# Patient Record
Sex: Female | Born: 1995 | Race: Black or African American | Hispanic: No | Marital: Single | Smoking: Current every day smoker
Health system: Southern US, Community
[De-identification: ages and names within clinical notes are randomized; demographics above are authoritative.]

## PROBLEM LIST (undated history)

## (undated) DIAGNOSIS — B977 Papillomavirus as the cause of diseases classified elsewhere: Secondary | ICD-10-CM

## (undated) DIAGNOSIS — B86 Scabies: Secondary | ICD-10-CM

## (undated) HISTORY — PX: NO PAST SURGERIES: SHX2092

---

## 2014-11-06 ENCOUNTER — Emergency Department (HOSPITAL_COMMUNITY)
Admission: EM | Admit: 2014-11-06 | Discharge: 2014-11-06 | Disposition: A | Discharge status: Home / Self Care | Payer: Medicaid Other | Attending: Emergency Medicine | Admitting: Emergency Medicine

## 2014-11-06 ENCOUNTER — Emergency Department (HOSPITAL_COMMUNITY): Payer: Medicaid Other

## 2014-11-06 ENCOUNTER — Encounter (HOSPITAL_COMMUNITY): Payer: Self-pay | Admitting: *Deleted

## 2014-11-06 DIAGNOSIS — R000 Tachycardia, unspecified: Secondary | ICD-10-CM | POA: Insufficient documentation

## 2014-11-06 DIAGNOSIS — Z3490 Encounter for supervision of normal pregnancy, unspecified, unspecified trimester: Secondary | ICD-10-CM

## 2014-11-06 DIAGNOSIS — B349 Viral infection, unspecified: Secondary | ICD-10-CM

## 2014-11-06 DIAGNOSIS — O98811 Other maternal infectious and parasitic diseases complicating pregnancy, first trimester: Secondary | ICD-10-CM | POA: Insufficient documentation

## 2014-11-06 DIAGNOSIS — Z3A01 Less than 8 weeks gestation of pregnancy: Secondary | ICD-10-CM | POA: Diagnosis not present

## 2014-11-06 DIAGNOSIS — O9989 Other specified diseases and conditions complicating pregnancy, childbirth and the puerperium: Secondary | ICD-10-CM | POA: Insufficient documentation

## 2014-11-06 DIAGNOSIS — F17210 Nicotine dependence, cigarettes, uncomplicated: Secondary | ICD-10-CM | POA: Insufficient documentation

## 2014-11-06 DIAGNOSIS — O99331 Smoking (tobacco) complicating pregnancy, first trimester: Secondary | ICD-10-CM | POA: Insufficient documentation

## 2014-11-06 LAB — BASIC METABOLIC PANEL
Anion gap: 10 (ref 5–15)
BUN: 6 mg/dL (ref 6–20)
CO2: 21 mmol/L — AB (ref 22–32)
CREATININE: 1.03 mg/dL — AB (ref 0.44–1.00)
Calcium: 9.3 mg/dL (ref 8.9–10.3)
Chloride: 103 mmol/L (ref 101–111)
GFR calc Af Amer: >60 mL/min (ref >60)
GLUCOSE: 86 mg/dL (ref 65–99)
POTASSIUM: 4.2 mmol/L (ref 3.5–5.1)
Sodium: 134 mmol/L — ABNORMAL LOW (ref 135–145)

## 2014-11-06 LAB — CBC
HCT: 46.1 % — ABNORMAL HIGH (ref 36.0–46.0)
Hemoglobin: 15.3 g/dL — ABNORMAL HIGH (ref 12.0–15.0)
MCH: 27 pg (ref 26.0–34.0)
MCHC: 33.2 g/dL (ref 30.0–36.0)
MCV: 81.3 fL (ref 78.0–100.0)
Platelets: 266 10*3/uL (ref 150–400)
RBC: 5.67 MIL/uL — AB (ref 3.87–5.11)
RDW: 13.9 % (ref 11.5–15.5)
WBC: 6.9 10*3/uL (ref 4.0–10.5)

## 2014-11-06 LAB — POC URINE PREG, ED: PREG TEST UR: POSITIVE — AB

## 2014-11-06 MED ORDER — ALBUTEROL SULFATE HFA 108 (90 BASE) MCG/ACT IN AERS
2.0000 | INHALATION_SPRAY | Freq: Once | RESPIRATORY_TRACT | Status: AC
  Administered 2014-11-06: 2 via RESPIRATORY_TRACT
  Filled 2014-11-06: qty 6.7

## 2014-11-06 MED ORDER — PERMETHRIN 5 % EX CREA: TOPICAL_CREAM | CUTANEOUS | Status: DC

## 2014-11-06 MED ORDER — PRENATAL COMPLETE 14-0.4 MG PO TABS: 1.0000 | ORAL_TABLET | Freq: Every day | ORAL | Status: DC

## 2014-11-06 NOTE — ED Provider Notes (Signed)
CSN: [id number]     Arrival date & time 11/06/14  1207 History   First MD Initiated Contact with Patient 11/06/14 1243     Chief Complaint  Patient presents with  . Shortness of Breath     (Consider location/radiation/quality/duration/timing/severity/associated sxs/prior Treatment) HPI  Madeline [name] is a 19 y.o. female presenting with 2 days of cough productive of thick green mucus as well as rhinorrhea, sore throat and shortness of breath. She denies chest pain. Shortness of breath worse with cough. Patient denies fevers but endorses chills. Patient is not taken anything for her symptoms. She denies alleviating or aggravating factors. Patient smokes half pack of cigarettes daily. No history of asthma. Patient also reports generalized weakness and pains. Pt denies history of DVT, PE, recent surgery or trauma, malignancy, hemoptysis, exogenous estrogen use, unilateral leg swelling or tenderness, immobilization.   Patient also complaining of 2 weeks of diffuse pruritus worse on legs and hands. Boyfriend recently diagnosed with scabies. Denies groin involvement.   History reviewed. No pertinent past medical history. History reviewed. No pertinent past surgical history. No family history on file. History  Substance Use Topics  . Smoking status: Current Every Day Smoker  . Smokeless tobacco: Not on file  . Alcohol Use: No   OB History    No data available     Review of Systems 10 Systems reviewed and are negative for acute change except as noted in the HPI.    Allergies  Review of patient's allergies indicates no known allergies.  Home Medications   Prior to Admission medications   Medication Sig Start Date End Date Taking? Authorizing Provider  permethrin (ELIMITE) 5 % cream Apply to affected area once and then again one week later 11/06/14   Oswaldo ConroyVictoria Shondell Fabel, PA-C  Prenatal Vit-Fe Fumarate-FA (PRENATAL COMPLETE) 14-0.4 MG TABS Take 1 tablet by mouth daily. 11/06/14   Oswaldo ConroyVictoria  Nandita Mathenia, PA-C   BP 124/84 mmHg  Pulse 96  Temp(Src) 98 F (36.7 C) (Oral)  Resp 16  Ht 5\' 7"  (1.702 m)  Wt 164 lb (74.39 kg)  BMI 25.68 kg/m2  SpO2 100%  LMP 10/07/2014 Physical Exam  Constitutional: She appears well-developed and well-nourished. No distress.  HENT:  Head: Normocephalic and atraumatic.  Mouth/Throat: Posterior oropharyngeal edema and posterior oropharyngeal erythema present. No oropharyngeal exudate.  Eyes: Conjunctivae and EOM are normal. Right eye exhibits no discharge. Left eye exhibits no discharge.  Cardiovascular: Regular rhythm.   tachycardia  Pulmonary/Chest: Effort normal and breath sounds normal. No respiratory distress. She has no wheezes.  Abdominal: Soft. Bowel sounds are normal. She exhibits no distension. There is no tenderness.  Lymphadenopathy:    She has cervical adenopathy.  Neurological: She is alert. She exhibits normal muscle tone. Coordination normal.  Skin: Skin is warm and dry. She is not diaphoretic.  No clear tracks to lower lip spacing. Patient with excoriation to diffuse lower extremity. No rash. No evidence of secondary infection.  Nursing note and vitals reviewed.   ED Course  Procedures (including critical care time) Labs Review Labs Reviewed  CBC - Abnormal; Notable for the following:    RBC 5.67 (*)    Hemoglobin 15.3 (*)    HCT 46.1 (*)    All other components within normal limits  BASIC METABOLIC PANEL - Abnormal; Notable for the following:    Sodium 134 (*)    CO2 21 (*)    Creatinine, Ser 1.03 (*)    All other components within normal limits  POC URINE PREG, ED - Abnormal; Notable for the following:    Preg Test, Ur POSITIVE (*)    All other components within normal limits    Imaging Review No results found.   EKG Interpretation   Date/Time:  Sunday Nov 06 2014 12:59:22 EDT Ventricular Rate:  94 PR Interval:  144 QRS Duration: 75 QT Interval:  331 QTC Calculation: 414 R Axis:   42 Text Interpretation:   Sinus rhythm Confirmed by Fayrene Fearing  MD, MARK ([id number]) on  11/06/2014 1:22:38 PM      Meds given in ED:  Medications  albuterol (PROVENTIL HFA;VENTOLIN HFA) 108 (90 BASE) MCG/ACT inhaler 2 puff (2 puffs Inhalation Given 11/06/14 1408)    Discharge Medication List as of 11/06/2014  3:21 PM    START taking these medications   Details  Prenatal Vit-Fe Fumarate-FA (PRENATAL COMPLETE) 14-0.4 MG TABS Take 1 tablet by mouth daily., Starting 11/06/2014, Until Discontinued, Print           MDM   Final diagnoses:  Viral syndrome  Pregnancy   Patient presenting with shortness of breath, cough, rhinorrhea. No chest pain. Lungs clear to auscultation bilaterally, cervical lymphadenopathy. Pt reports improvement of symptoms with albuterol. Patient with initial tachycardia and is low risk factors for PE. No hypoxia. No respiratory distress. I doubt PE in the setting of viral syndrome. Pt also pregnant which is likely contributing to higher HR. Pt refusing CXR due to pregnancy. I doubt PNA without fever or leukocytosis. Pt with viral syndrome.  Discussed return precautions. Pt also presenting with pruritis and close contact diagnosed with scabies. Pt without rash. Permethrin provided. Pt to take either claritin or benadryl for pruritis. Pt given prenatal vitamins and [facility]  Discussed return precautions with patient. Discussed all results and patient verbalizes understanding and agrees with plan.  Case has been discussed with Dr. Fayrene Fearing who agrees with the above plan and to discharge.      Oswaldo Conroy, PA-C 11/07/14 1021  Rolland Porter, MD 11/16/14 [phone]

## 2014-11-06 NOTE — ED Notes (Signed)
Pt thinks she has a cold and reports symptoms of cough, stuffy nose, sore throat, and feels like she is short of breath.  No asthma.  Pt feels weak

## 2014-11-06 NOTE — ED Notes (Signed)
Pt sts that she does not want x-ray

## 2014-11-06 NOTE — Discharge Instructions (Signed)
Return to the emergency room with worsening of symptoms, new symptoms or with symptoms that are concerning, especially fevers, chest pain, shortness of breath, difficulty breathing, coughing up blood. Start taking prenatal vitamins. Drink plenty of fluids with electrolytes. Follow-up with [facility] for prenatal care. Albuterol inhaler for shortness of breath. Claritin or Zyrtec without decongestant for allergies. Read below information and follow recommendations. First Trimester of Pregnancy The first trimester of pregnancy is from week 1 until the end of week 12 (months 1 through 3). A week after a sperm fertilizes an egg, the egg will implant on the wall of the uterus. This embryo will begin to develop into a baby. Genes from you and your partner are forming the baby. The female genes determine whether the baby is a boy or a girl. At 6-8 weeks, the eyes and face are formed, and the heartbeat can be seen on ultrasound. At the end of 12 weeks, all the baby's organs are formed.  Now that you are pregnant, you will want to do everything you can to have a healthy baby. Two of the most important things are to get good prenatal care and to follow your health care provider's instructions. Prenatal care is all the medical care you receive before the baby's birth. This care will help prevent, find, and treat any problems during the pregnancy and childbirth. BODY CHANGES Your body goes through many changes during pregnancy. The changes vary from woman to woman.   You may gain or lose a couple of pounds at first.  You may feel sick to your stomach (nauseous) and throw up (vomit). If the vomiting is uncontrollable, call your health care provider.  You may tire easily.  You may develop headaches that can be relieved by medicines approved by your health care provider.  You may urinate more often. Painful urination may mean you have a bladder infection.  You may develop heartburn as a result of your  pregnancy.  You may develop constipation because certain hormones are causing the muscles that push waste through your intestines to slow down.  You may develop hemorrhoids or swollen, bulging veins (varicose veins).  Your breasts may begin to grow larger and become tender. Your nipples may stick out more, and the tissue that surrounds them (areola) may become darker.  Your gums may bleed and may be sensitive to brushing and flossing.  Dark spots or blotches (chloasma, mask of pregnancy) may develop on your face. This will likely fade after the baby is born.  Your menstrual periods will stop.  You may have a loss of appetite.  You may develop cravings for certain kinds of food.  You may have changes in your emotions from day to day, such as being excited to be pregnant or being concerned that something may go wrong with the pregnancy and baby.  You may have more vivid and strange dreams.  You may have changes in your hair. These can include thickening of your hair, rapid growth, and changes in texture. Some women also have hair loss during or after pregnancy, or hair that feels dry or thin. Your hair will most likely return to normal after your baby is born. WHAT TO EXPECT AT YOUR PRENATAL VISITS During a routine prenatal visit:  You will be weighed to make sure you and the baby are growing normally.  Your blood pressure will be taken.  Your abdomen will be measured to track your baby's growth.  The fetal heartbeat will be listened to  starting around week 10 or 12 of your pregnancy.  Test results from any previous visits will be discussed. Your health care provider may ask you:  How you are feeling.  If you are feeling the baby move.  If you have had any abnormal symptoms, such as leaking fluid, bleeding, severe headaches, or abdominal cramping.  If you have any questions. Other tests that may be performed during your first trimester include:  Blood tests to find your  blood type and to check for the presence of any previous infections. They will also be used to check for low iron levels (anemia) and Rh antibodies. Later in the pregnancy, blood tests for diabetes will be done along with other tests if problems develop.  Urine tests to check for infections, diabetes, or protein in the urine.  An ultrasound to confirm the proper growth and development of the baby.  An amniocentesis to check for possible genetic problems.  Fetal screens for spina bifida and Down syndrome.  You may need other tests to make sure you and the baby are doing well. HOME CARE INSTRUCTIONS  Medicines  Follow your health care provider's instructions regarding medicine use. Specific medicines may be either safe or unsafe to take during pregnancy.  Take your prenatal vitamins as directed.  If you develop constipation, try taking a stool softener if your health care provider approves. Diet  Eat regular, well-balanced meals. Choose a variety of foods, such as meat or vegetable-based protein, fish, milk and low-fat dairy products, vegetables, fruits, and whole grain breads and cereals. Your health care provider will help you determine the amount of weight gain that is right for you.  Avoid raw meat and uncooked cheese. These carry germs that can cause birth defects in the baby.  Eating four or five small meals rather than three large meals a day may help relieve nausea and vomiting. If you start to feel nauseous, eating a few soda crackers can be helpful. Drinking liquids between meals instead of during meals also seems to help nausea and vomiting.  If you develop constipation, eat more high-fiber foods, such as fresh vegetables or fruit and whole grains. Drink enough fluids to keep your urine clear or pale yellow. Activity and Exercise  Exercise only as directed by your health care provider. Exercising will help you:  Control your weight.  Stay in shape.  Be prepared for labor  and delivery.  Experiencing pain or cramping in the lower abdomen or low back is a good sign that you should stop exercising. Check with your health care provider before continuing normal exercises.  Try to avoid standing for long periods of time. Move your legs often if you must stand in one place for a long time.  Avoid heavy lifting.  Wear low-heeled shoes, and practice good posture.  You may continue to have sex unless your health care provider directs you otherwise. Relief of Pain or Discomfort  Wear a good support bra for breast tenderness.   Take warm sitz baths to soothe any pain or discomfort caused by hemorrhoids. Use hemorrhoid cream if your health care provider approves.   Rest with your legs elevated if you have leg cramps or low back pain.  If you develop varicose veins in your legs, wear support hose. Elevate your feet for 15 minutes, 3-4 times a day. Limit salt in your diet. Prenatal Care  Schedule your prenatal visits by the twelfth week of pregnancy. They are usually scheduled monthly at first, then more  often in the last 2 months before delivery.  Write down your questions. Take them to your prenatal visits.  Keep all your prenatal visits as directed by your health care provider. Safety  Wear your seat belt at all times when driving.  Make a list of emergency phone numbers, including numbers for family, friends, the hospital, and police and fire departments. General Tips  Ask your health care provider for a referral to a local prenatal education class. Begin classes no later than at the beginning of month 6 of your pregnancy.  Ask for help if you have counseling or nutritional needs during pregnancy. Your health care provider can offer advice or refer you to specialists for help with various needs.  Do not use hot tubs, steam rooms, or saunas.  Do not douche or use tampons or scented sanitary pads.  Do not cross your legs for long periods of  time.  Avoid cat litter boxes and soil used by cats. These carry germs that can cause birth defects in the baby and possibly loss of the fetus by miscarriage or stillbirth.  Avoid all smoking, herbs, alcohol, and medicines not prescribed by your health care provider. Chemicals in these affect the formation and growth of the baby.  Schedule a dentist appointment. At home, brush your teeth with a soft toothbrush and be gentle when you floss. SEEK MEDICAL CARE IF:   You have dizziness.  You have mild pelvic cramps, pelvic pressure, or nagging pain in the abdominal area.  You have persistent nausea, vomiting, or diarrhea.  You have a bad smelling vaginal discharge.  You have pain with urination.  You notice increased swelling in your face, hands, legs, or ankles. SEEK IMMEDIATE MEDICAL CARE IF:   You have a fever.  You are leaking fluid from your vagina.  You have spotting or bleeding from your vagina.  You have severe abdominal cramping or pain.  You have rapid weight gain or loss.  You vomit blood or material that looks like coffee grounds.  You are exposed to [place] and have never had them.  You are exposed to fifth disease or chickenpox.  You develop a severe headache.  You have shortness of breath.  You have any kind of trauma, such as from a fall or a car accident. Document Released: 05/21/2001 Document Revised: 10/11/2013 Document Reviewed: 04/06/2013 [facility] Patient Information 2015 [place], [place]. This information is not intended to replace advice given to you by your health care provider. Make sure you discuss any questions you have with your health care provider.  Shortness of Breath Shortness of breath means you have trouble breathing. It could also mean that you have a medical problem. You should get immediate medical care for shortness of breath. CAUSES   Not enough oxygen in the air such as with high altitudes or a smoke-filled room.  Certain  lung diseases, infections, or problems.  Heart disease or conditions, such as angina or heart failure.  Low red blood cells (anemia).  Poor physical fitness, which can cause shortness of breath when you exercise.  Chest or back injuries or stiffness.  Being overweight.  Smoking.  Anxiety, which can make you feel like you are not getting enough air. DIAGNOSIS  Serious medical problems can often be found during your physical exam. Tests may also be done to determine why you are having shortness of breath. Tests may include:  Chest X-rays.  Lung function tests.  Blood tests.  An electrocardiogram (ECG).  An  ambulatory electrocardiogram. An ambulatory ECG records your heartbeat patterns over a 24-hour period.  Exercise testing.  A transthoracic echocardiogram (TTE). During echocardiography, sound waves are used to evaluate how blood flows through your heart.  A transesophageal echocardiogram (TEE).  Imaging scans. Your health care provider may not be able to find a cause for your shortness of breath after your exam. In this case, it is important to have a follow-up exam with your health care provider as directed.  TREATMENT  Treatment for shortness of breath depends on the cause of your symptoms and can vary greatly. HOME CARE INSTRUCTIONS   Do not smoke. Smoking is a common cause of shortness of breath. If you smoke, ask for help to quit.  Avoid being around chemicals or things that may bother your breathing, such as paint fumes and dust.  Rest as needed. Slowly resume your usual activities.  If medicines were prescribed, take them as directed for the full length of time directed. This includes oxygen and any inhaled medicines.  Keep all follow-up appointments as directed by your health care provider. SEEK MEDICAL CARE IF:   Your condition does not improve in the time expected.  You have a hard time doing your normal activities even with rest.  You have any new  symptoms. SEEK IMMEDIATE MEDICAL CARE IF:   Your shortness of breath gets worse.  You feel light-headed, faint, or develop a cough not controlled with medicines.  You start coughing up blood.  You have pain with breathing.  You have chest pain or pain in your arms, shoulders, or abdomen.  You have a fever.  You are unable to walk up stairs or exercise the way you normally do. MAKE SURE YOU:  Understand these instructions.  Will watch your condition.  Will get help right away if you are not doing well or get worse. Document Released: 02/19/2001 Document Revised: 06/01/2013 Document Reviewed: 08/12/2011 [facility] Patient Information 2015 [place], [place]. This information is not intended to replace advice given to you by your health care provider. Make sure you discuss any questions you have with your health care provider.

## 2014-11-06 NOTE — ED Notes (Signed)
Pt complaining of flea or scabies and itch

## 2014-11-26 ENCOUNTER — Encounter (HOSPITAL_COMMUNITY): Payer: Self-pay | Admitting: Nurse Practitioner

## 2014-11-26 ENCOUNTER — Emergency Department (HOSPITAL_COMMUNITY)
Admission: EM | Admit: 2014-11-26 | Discharge: 2014-11-26 | Disposition: A | Discharge status: Home / Self Care | Payer: Medicaid Other | Attending: Emergency Medicine | Admitting: Emergency Medicine

## 2014-11-26 ENCOUNTER — Emergency Department (HOSPITAL_COMMUNITY): Payer: Medicaid Other

## 2014-11-26 DIAGNOSIS — O23591 Infection of other part of genital tract in pregnancy, first trimester: Secondary | ICD-10-CM | POA: Insufficient documentation

## 2014-11-26 DIAGNOSIS — O26891 Other specified pregnancy related conditions, first trimester: Secondary | ICD-10-CM | POA: Diagnosis present

## 2014-11-26 DIAGNOSIS — O209 Hemorrhage in early pregnancy, unspecified: Secondary | ICD-10-CM | POA: Diagnosis not present

## 2014-11-26 DIAGNOSIS — O99331 Smoking (tobacco) complicating pregnancy, first trimester: Secondary | ICD-10-CM | POA: Insufficient documentation

## 2014-11-26 DIAGNOSIS — Z3A01 Less than 8 weeks gestation of pregnancy: Secondary | ICD-10-CM | POA: Diagnosis not present

## 2014-11-26 DIAGNOSIS — R1030 Lower abdominal pain, unspecified: Secondary | ICD-10-CM | POA: Insufficient documentation

## 2014-11-26 DIAGNOSIS — B9689 Other specified bacterial agents as the cause of diseases classified elsewhere: Secondary | ICD-10-CM

## 2014-11-26 DIAGNOSIS — R109 Unspecified abdominal pain: Secondary | ICD-10-CM

## 2014-11-26 DIAGNOSIS — N760 Acute vaginitis: Secondary | ICD-10-CM

## 2014-11-26 DIAGNOSIS — Z3490 Encounter for supervision of normal pregnancy, unspecified, unspecified trimester: Secondary | ICD-10-CM

## 2014-11-26 DIAGNOSIS — F17200 Nicotine dependence, unspecified, uncomplicated: Secondary | ICD-10-CM | POA: Insufficient documentation

## 2014-11-26 DIAGNOSIS — O210 Mild hyperemesis gravidarum: Secondary | ICD-10-CM | POA: Diagnosis not present

## 2014-11-26 LAB — CBC WITH DIFFERENTIAL/PLATELET
Basophils Absolute: 0 10*3/uL (ref 0.0–0.1)
Basophils Relative: 0 % (ref 0–1)
Eosinophils Absolute: 0.1 10*3/uL (ref 0.0–0.7)
Eosinophils Relative: 1 % (ref 0–5)
HEMATOCRIT: 39.1 % (ref 36.0–46.0)
Hemoglobin: 13.3 g/dL (ref 12.0–15.0)
Lymphocytes Relative: 31 % (ref 12–46)
Lymphs Abs: 2.2 10*3/uL (ref 0.7–4.0)
MCH: 27.3 pg (ref 26.0–34.0)
MCHC: 34 g/dL (ref 30.0–36.0)
MCV: 80.3 fL (ref 78.0–100.0)
Monocytes Absolute: 0.6 10*3/uL (ref 0.1–1.0)
Monocytes Relative: 8 % (ref 3–12)
NEUTROS ABS: 4.3 10*3/uL (ref 1.7–7.7)
NEUTROS PCT: 60 % (ref 43–77)
Platelets: 447 10*3/uL — ABNORMAL HIGH (ref 150–400)
RBC: 4.87 MIL/uL (ref 3.87–5.11)
RDW: 13.2 % (ref 11.5–15.5)
WBC: 7.2 10*3/uL (ref 4.0–10.5)

## 2014-11-26 LAB — COMPREHENSIVE METABOLIC PANEL
ALBUMIN: 3.4 g/dL — AB (ref 3.5–5.0)
ALT: 15 U/L (ref 14–54)
ANION GAP: 9 (ref 5–15)
AST: 18 U/L (ref 15–41)
Alkaline Phosphatase: 43 U/L (ref 38–126)
BILIRUBIN TOTAL: 0.6 mg/dL (ref 0.3–1.2)
CALCIUM: 9.5 mg/dL (ref 8.9–10.3)
CHLORIDE: 105 mmol/L (ref 101–111)
CO2: 22 mmol/L (ref 22–32)
CREATININE: 0.89 mg/dL (ref 0.44–1.00)
GFR calc Af Amer: >60 mL/min (ref >60)
Glucose, Bld: 93 mg/dL (ref 65–99)
Potassium: 4.3 mmol/L (ref 3.5–5.1)
Sodium: 136 mmol/L (ref 135–145)
Total Protein: 7.3 g/dL (ref 6.5–8.1)

## 2014-11-26 LAB — URINE MICROSCOPIC-ADD ON

## 2014-11-26 LAB — URINALYSIS, ROUTINE W REFLEX MICROSCOPIC
Bilirubin Urine: NEGATIVE
GLUCOSE, UA: NEGATIVE mg/dL
Hgb urine dipstick: NEGATIVE
Ketones, ur: >80 mg/dL — AB
Nitrite: NEGATIVE
PH: 5.5 (ref 5.0–8.0)
Protein, ur: NEGATIVE mg/dL
Specific Gravity, Urine: 1.025 (ref 1.005–1.030)
Urobilinogen, UA: 1 mg/dL (ref 0.0–1.0)

## 2014-11-26 LAB — WET PREP, GENITAL
Trich, Wet Prep: NONE SEEN
Yeast Wet Prep HPF POC: NONE SEEN

## 2014-11-26 LAB — ABO/RH: ABO/RH(D): B POS

## 2014-11-26 LAB — I-STAT BETA HCG BLOOD, ED (MC, WL, AP ONLY): I-stat hCG, quantitative: >2000 m[IU]/mL — ABNORMAL HIGH (ref <5)

## 2014-11-26 MED ORDER — ONDANSETRON HCL 4 MG/2ML IJ SOLN
4.0000 mg | Freq: Once | INTRAMUSCULAR | Status: AC
  Administered 2014-11-26: 4 mg via INTRAVENOUS
  Filled 2014-11-26: qty 2

## 2014-11-26 MED ORDER — METRONIDAZOLE 500 MG PO TABS: 500.0000 mg | ORAL_TABLET | Freq: Two times a day (BID) | ORAL | Status: DC

## 2014-11-26 NOTE — Discharge Instructions (Signed)
°Abdominal Pain During Pregnancy °Abdominal pain is common in pregnancy. Most of the time, it does not cause harm. There are many causes of abdominal pain. Some causes are more serious than others. Some of the causes of abdominal pain in pregnancy are easily diagnosed. Occasionally, the diagnosis takes time to understand. Other times, the cause is not determined. Abdominal pain can be a sign that something is very wrong with the pregnancy, or the pain may have nothing to do with the pregnancy at all. For this reason, always tell your health care provider if you have any abdominal discomfort. °HOME CARE INSTRUCTIONS  °Monitor your abdominal pain for any changes. The following actions may help to alleviate any discomfort you are experiencing: °· Do not have sexual intercourse or put anything in your vagina until your symptoms go away completely. °· Get plenty of rest until your pain improves. °· Drink clear fluids if you feel nauseous. Avoid solid food as long as you are uncomfortable or nauseous. °· Only take over-the-counter or prescription medicine as directed by your health care provider. °· Keep all follow-up appointments with your health care provider. °SEEK IMMEDIATE MEDICAL CARE IF: °· You are bleeding, leaking fluid, or passing tissue from the vagina. °· You have increasing pain or cramping. °· You have persistent vomiting. °· You have painful or bloody urination. °· You have a fever. °· You notice a decrease in your baby's movements. °· You have extreme weakness or feel faint. °· You have shortness of breath, with or without abdominal pain. °· You develop a severe headache with abdominal pain. °· You have abnormal vaginal discharge with abdominal pain. °· You have persistent diarrhea. °· You have abdominal pain that continues even after rest, or gets worse. °MAKE SURE YOU:  °· Understand these instructions. °· Will watch your condition. °· Will get help right away if you are not doing well or get  worse. °Document Released: 05/27/2005 Document Revised: 03/17/2013 Document Reviewed: 12/24/2012 °ExitCare® Patient Information ©2015 [employer]. This information is not intended to replace advice given to you by your health care provider. Make sure you discuss any questions you have with your health care provider. °Bacterial Vaginosis °Bacterial vaginosis is a vaginal infection that occurs when the normal balance of bacteria in the vagina is disrupted. It results from an overgrowth of certain bacteria. This is the most common vaginal infection in women of childbearing age. Treatment is important to prevent complications, especially in pregnant women, as it can cause a premature delivery. °CAUSES  °Bacterial vaginosis is caused by an increase in harmful bacteria that are normally present in smaller amounts in the vagina. Several different kinds of bacteria can cause bacterial vaginosis. However, the reason that the condition develops is not fully understood. °RISK FACTORS °Certain activities or behaviors can put you at an increased risk of developing bacterial vaginosis, including: °· Having a new sex partner or multiple sex partners. °· Douching. °· Using an intrauterine device (IUD) for contraception. °Women do not get bacterial vaginosis from toilet seats, bedding, swimming pools, or contact with objects around them. °SIGNS AND SYMPTOMS  °Some women with bacterial vaginosis have no signs or symptoms. Common symptoms include: °· Grey vaginal discharge. °· A fishlike odor with discharge, especially after sexual intercourse. °· Itching or burning of the vagina and vulva. °· Burning or pain with urination. °DIAGNOSIS  °Your health care provider will take a medical history and examine the vagina for signs of bacterial vaginosis. A sample of vaginal fluid may   be taken. Your health care provider will look at this sample under a microscope to check for bacteria and abnormal cells. A vaginal pH test may also be done.   °TREATMENT  °Bacterial vaginosis may be treated with antibiotic medicines. These may be given in the form of a pill or a vaginal cream. A second round of antibiotics may be prescribed if the condition comes back after treatment.  °HOME CARE INSTRUCTIONS  °· Only take over-the-counter or prescription medicines as directed by your health care provider. °· If antibiotic medicine was prescribed, take it as directed. Make sure you finish it even if you start to feel better. °· Do not have sex until treatment is completed. °· Tell all sexual partners that you have a vaginal infection. They should see their health care provider and be treated if they have problems, such as a mild rash or itching. °· Practice safe sex by using condoms and only having one sex partner. °SEEK MEDICAL CARE IF:  °· Your symptoms are not improving after 3 days of treatment. °· You have increased discharge or pain. °· You have a fever. °MAKE SURE YOU:  °· Understand these instructions. °· Will watch your condition. °· Will get help right away if you are not doing well or get worse. °FOR MORE INFORMATION  °Centers for Disease Control and Prevention, Division of STD Prevention: www.cdc.gov/std °American Sexual Health Association (ASHA): www.ashastd.org  °Document Released: 05/27/2005 Document Revised: 03/17/2013 Document Reviewed: 01/06/2013 °ExitCare® Patient Information ©2015 [employer]. This information is not intended to replace advice given to you by your health care provider. Make sure you discuss any questions you have with your health care provider. ° °

## 2014-11-26 NOTE — ED Notes (Addendum)
She c/o generalized abd pain since she found out she was pregnant [redacted] weeks ago. She reports a positive pregnancy test here 3 weeks ago. She also reports nausea. She tried to eat bread and take a back and body bill with no relief. She states she does not know her EDD. Denies urinary changes. Reports light vaginal spotting this week

## 2014-11-26 NOTE — ED Provider Notes (Signed)
CSN: [id number]     Arrival date & time 11/26/14  1055 History   First MD Initiated Contact with Patient 11/26/14 1114     Chief Complaint  Patient presents with  . Abdominal Pain     (Consider location/radiation/quality/duration/timing/severity/associated sxs/prior Treatment) HPI Comments: Patient presents with lower abdominal pain. Her last menstrual period was April 29. She recently found out she was pregnant. This is her first pregnancy. She states about a week and half ago she started having some crampy pain across her lower abdomen. It's been continuing and worsened over the last few days. She had a little bit of spotting a few days ago but hasn't had any today. She denies any vaginal discharge. She's had a lot of nausea and had one episode of vomiting today. She is not yet established prenatal care. She denies any urinary symptoms. She does say that she took a back and body pill for the pain, but doesn't know what was in it.  Patient is a 19 y.o. female presenting with abdominal pain.  Abdominal Pain Associated symptoms: nausea, vaginal bleeding and vomiting   Associated symptoms: no chest pain, no chills, no cough, no diarrhea, no fatigue, no fever, no hematuria, no shortness of breath and no vaginal discharge     History reviewed. No pertinent past medical history. History reviewed. No pertinent past surgical history. History reviewed. No pertinent family history. History  Substance Use Topics  . Smoking status: Current Every Day Smoker  . Smokeless tobacco: Not on file  . Alcohol Use: No   OB History    No data available     Review of Systems  Constitutional: Negative for fever, chills, diaphoresis and fatigue.  HENT: Negative for congestion, rhinorrhea and sneezing.   Eyes: Negative.   Respiratory: Negative for cough, chest tightness and shortness of breath.   Cardiovascular: Negative for chest pain and leg swelling.  Gastrointestinal: Positive for nausea, vomiting and  abdominal pain. Negative for diarrhea and blood in stool.  Genitourinary: Positive for vaginal bleeding. Negative for frequency, hematuria, flank pain, vaginal discharge and difficulty urinating.  Musculoskeletal: Negative for back pain and arthralgias.  Skin: Negative for rash.  Neurological: Negative for dizziness, speech difficulty, weakness, numbness and headaches.      Allergies  Review of patient's allergies indicates no known allergies.  Home Medications   Prior to Admission medications   Medication Sig Start Date End Date Taking? Authorizing Provider  Aspirin-Caffeine (BAYER BACK & BODY PAIN EX ST) 500-32.5 MG TABS Take 1 tablet by mouth once.   Yes Historical Provider, MD  metroNIDAZOLE (FLAGYL) 500 MG tablet Take 1 tablet (500 mg total) by mouth 2 (two) times daily. One po bid x 7 days 11/26/14   Rolan Bucco, MD   BP 99/50 mmHg  Pulse 59  Temp(Src) 98.7 F (37.1 C) (Oral)  Resp 18  Ht  (1.702 m)  Wt 155 lb 7 oz (70.506 kg)  BMI 24.34 kg/m2  SpO2 99%  LMP 10/07/2014 Physical Exam  Constitutional: She is oriented to person, place, and time. She appears well-developed and well-nourished.  HENT:  Head: Normocephalic and atraumatic.  Eyes: Pupils are equal, round, and reactive to light.  Neck: Normal range of motion. Neck supple.  Cardiovascular: Normal rate, regular rhythm and normal heart sounds.   Pulmonary/Chest: Effort normal and breath sounds normal. No respiratory distress. She has no wheezes. She has no rales. She exhibits no tenderness.  Abdominal: Soft. Bowel sounds are normal. There is tenderness (  mild lower abd tenderness). There is no rebound and no guarding.  Genitourinary:  Large amount of thick white discharge. No cervical motion tenderness or adnexal tenderness. The cervix is closed.  Musculoskeletal: Normal range of motion. She exhibits no edema.  Lymphadenopathy:    She has no cervical adenopathy.  Neurological: She is alert and oriented to  person, place, and time.  Skin: Skin is warm and dry. No rash noted.  Psychiatric: She has a normal mood and affect.    ED Course  Procedures (including critical care time) Labs Review Labs Reviewed  WET PREP, GENITAL - Abnormal; Notable for the following:    Clue Cells Wet Prep HPF POC MANY (*)    WBC, Wet Prep HPF POC MANY (*)    All other components within normal limits  CBC WITH DIFFERENTIAL/PLATELET - Abnormal; Notable for the following:    Platelets 447 (*)    All other components within normal limits  COMPREHENSIVE METABOLIC PANEL - Abnormal; Notable for the following:    BUN <5 (*)    Albumin 3.4 (*)    All other components within normal limits  URINALYSIS, ROUTINE W REFLEX MICROSCOPIC (NOT AT [facility]) - Abnormal; Notable for the following:    Ketones, ur >80 (*)    Leukocytes, UA SMALL (*)    All other components within normal limits  URINE MICROSCOPIC-ADD ON - Abnormal; Notable for the following:    Squamous Epithelial / LPF FEW (*)    All other components within normal limits  I-STAT BETA HCG BLOOD, ED ([facility], [facility], [facility] ONLY) - Abnormal; Notable for the following:    I-stat hCG, quantitative >2000.0 (*)    All other components within normal limits  URINE CULTURE  ABO/RH  GC/CHLAMYDIA PROBE AMP ([place]) NOT AT [facility]    Imaging Review US Ob Comp Less 14 Wks  11/26/2014   CLINICAL DATA:  Positive pregnancy test, pelvic pain and vaginal bleeding  EXAM: OBSTETRIC <14 WK [place] AND TRANSVAGINAL OB US  TECHNIQUE: Both transabdominal and transvaginal ultrasound examinations were performed for complete evaluation of the gestation as well as the maternal uterus, adnexal regions, and pelvic cul-de-sac. Transvaginal technique was performed to assess early pregnancy.  COMPARISON:  None.  FINDINGS: Intrauterine gestational sac: Visualized/normal in shape.  Yolk sac:  Visualized  Embryo:  Visualized  Cardiac Activity: Visualized  Heart Rate: 144  bpm  CRL:  7  mm   6 w   4 d                   [place] EDC: 07/18/15  Maternal uterus/adnexae: Ovaries are normal. No free fluid. Left ovarian corpus luteum. Hypoechoic oval fluid collection within the endometrium adjacent to the dominant gestational sac measuring 5 mm demonstrates an echogenic peripheral ring and could be a second gestational sac from failed twin pregnancy or possibly subchorionic hemorrhage.  IMPRESSION: Intrauterine gestational sac with yolk sac, fetal pole, and cardiac activity, measuring 6 weeks 4 days gestational age by today's crown-rump length.  A second intrauterine gestational sac measuring 5 mm could represent a failed twin pregnancy without fetal pole, yolk sac, or cardiac activity identified, or a small subchorionic hemorrhage in the setting of vaginal bleeding. This is amenable to followup at the time of first trimester ultrasound screening exam or anatomic survey.   Electronically Signed   By: Christiana Pellant M.D.   On: 11/26/2014 13:44   US Ob Transvaginal  11/26/2014   CLINICAL DATA:  Positive pregnancy test,  pelvic pain and vaginal bleeding  EXAM: OBSTETRIC <14 WK [place] AND TRANSVAGINAL OB US  TECHNIQUE: Both transabdominal and transvaginal ultrasound examinations were performed for complete evaluation of the gestation as well as the maternal uterus, adnexal regions, and pelvic cul-de-sac. Transvaginal technique was performed to assess early pregnancy.  COMPARISON:  None.  FINDINGS: Intrauterine gestational sac: Visualized/normal in shape.  Yolk sac:  Visualized  Embryo:  Visualized  Cardiac Activity: Visualized  Heart Rate: 144  bpm  CRL:  7  mm   6 w   4 d                  [place] EDC: 07/18/15  Maternal uterus/adnexae: Ovaries are normal. No free fluid. Left ovarian corpus luteum. Hypoechoic oval fluid collection within the endometrium adjacent to the dominant gestational sac measuring 5 mm demonstrates an echogenic peripheral ring and could be a second gestational sac from failed twin pregnancy or possibly subchorionic hemorrhage.   IMPRESSION: Intrauterine gestational sac with yolk sac, fetal pole, and cardiac activity, measuring 6 weeks 4 days gestational age by today's crown-rump length.  A second intrauterine gestational sac measuring 5 mm could represent a failed twin pregnancy without fetal pole, yolk sac, or cardiac activity identified, or a small subchorionic hemorrhage in the setting of vaginal bleeding. This is amenable to followup at the time of first trimester ultrasound screening exam or anatomic survey.   Electronically Signed   By: Christiana Pellant M.D.   On: 11/26/2014 13:44     EKG Interpretation None      MDM   Final diagnoses:  BV (bacterial vaginosis)  Pregnancy    Patient presents with lower abdominal pain. There is no evidence of an ectopic pregnancy. She does have bacterial vaginosis in a large amount of discharge. I will treat her with Flagyl. I encouraged her to have close follow-up with an OB/GYN. I gave her referral to women's outpatient clinic. I encouraged her to make sure that she's taking her prenatal vitamin. I advised her return here if she has any worsening symptoms. Her urine has leukocytes and white cells in it but she doesn't have symptoms consistent with the UTI. Her urine was sent for culture.    Rolan Bucco, MD 11/26/14 [phone]

## 2014-11-26 NOTE — ED Notes (Signed)
Pt to US.

## 2014-11-28 LAB — GC/CHLAMYDIA PROBE AMP (~~LOC~~) NOT AT ARMC
CHLAMYDIA, DNA PROBE: NEGATIVE
Neisseria Gonorrhea: NEGATIVE

## 2014-11-29 LAB — URINE CULTURE: Culture: >=100000

## 2014-11-30 ENCOUNTER — Telehealth (HOSPITAL_BASED_OUTPATIENT_CLINIC_OR_DEPARTMENT_OTHER): Payer: Self-pay | Admitting: Emergency Medicine

## 2014-11-30 NOTE — Progress Notes (Signed)
ED Antimicrobial Stewardship Positive Culture Follow Up   Madeline [name] is an 19 y.o. female who presented to [facility] on 11/26/2014 with a chief complaint of  Chief Complaint  Patient presents with  . Abdominal Pain    Recent Results (from the past 720 hour(s))  Wet prep, genital     Status: Abnormal   Collection Time: 11/26/14  2:51 PM  Result Value Ref Range Status   Yeast Wet Prep HPF POC NONE SEEN NONE SEEN Final   Trich, Wet Prep NONE SEEN NONE SEEN Final   Clue Cells Wet Prep HPF POC MANY (A) NONE SEEN Final   WBC, Wet Prep HPF POC MANY (A) NONE SEEN Final  Urine culture     Status: None   Collection Time: 11/26/14  3:10 PM  Result Value Ref Range Status   Specimen Description URINE, CLEAN CATCH  Final   Special Requests CX ADDED AT 0113 ON [id number]  Final   Culture >=100,000 COLONIES/mL ESCHERICHIA COLI  Final   Report Status 11/29/2014 FINAL  Final   Organism ID, Bacteria ESCHERICHIA COLI  Final      Susceptibility   Escherichia coli - MIC*    AMPICILLIN <=2 SENSITIVE Sensitive     CEFTAZIDIME <=1 SENSITIVE Sensitive     CEFTRIAXONE <=1 SENSITIVE Sensitive     CIPROFLOXACIN <=0.25 SENSITIVE Sensitive     GENTAMICIN <=1 SENSITIVE Sensitive     IMIPENEM <=0.25 SENSITIVE Sensitive     NITROFURANTOIN <=16 SENSITIVE Sensitive     TRIMETH/SULFA <=20 SENSITIVE Sensitive     AMPICILLIN/SULBACTAM <=2 SENSITIVE Sensitive     PIP/TAZO <=4 SENSITIVE Sensitive     * >=100,000 COLONIES/mL ESCHERICHIA COLI    [x]  Patient discharged originally without antimicrobial agent and treatment is now indicated  19 YOF **PREGNANT** with generalized abdominal pain and light vaginal spotting. Work-up revealed BV and the patient was started on Flagyl however urine cultures also grew 100k of E.coli - not yet treated.   New antibiotic prescription: Amoxicillin 500 mg bid x 10 days. Continue course of Flagyl as prescribed. *Very important that the patient completes treatment for UTI to avoid  any complications with pregnancy*   ED Provider: Danelle Berry, PA-C  Rolley Sims 11/30/2014, 12:36 PM Infectious Diseases Pharmacist Phone# [phone]

## 2014-12-04 ENCOUNTER — Telehealth: Payer: Self-pay | Admitting: Emergency Medicine

## 2014-12-04 NOTE — Telephone Encounter (Signed)
Unable to reach by phone after multiple attempts regarding lab results. Letter sent.

## 2014-12-12 ENCOUNTER — Telehealth (HOSPITAL_COMMUNITY): Payer: Self-pay

## 2014-12-12 NOTE — ED Notes (Signed)
Unable to contact pt by mail or telephone. Unable to communicate lab results or treatment changes. 

## 2015-01-05 ENCOUNTER — Other Ambulatory Visit (HOSPITAL_COMMUNITY): Payer: Self-pay | Admitting: Nurse Practitioner

## 2015-01-05 DIAGNOSIS — Z3682 Encounter for antenatal screening for nuchal translucency: Secondary | ICD-10-CM

## 2015-01-05 LAB — OB RESULTS CONSOLE HEPATITIS B SURFACE ANTIGEN: Hepatitis B Surface Ag: NEGATIVE

## 2015-01-05 LAB — OB RESULTS CONSOLE RPR: RPR: NONREACTIVE

## 2015-01-05 LAB — OB RESULTS CONSOLE ANTIBODY SCREEN: ANTIBODY SCREEN: NEGATIVE

## 2015-01-05 LAB — OB RESULTS CONSOLE HIV ANTIBODY (ROUTINE TESTING): HIV: NONREACTIVE

## 2015-01-05 LAB — OB RESULTS CONSOLE RUBELLA ANTIBODY, IGM: RUBELLA: IMMUNE

## 2015-01-05 LAB — OB RESULTS CONSOLE ABO/RH: RH Type: POSITIVE

## 2015-01-12 ENCOUNTER — Other Ambulatory Visit (HOSPITAL_COMMUNITY): Payer: Self-pay | Admitting: Nurse Practitioner

## 2015-01-12 ENCOUNTER — Ambulatory Visit (HOSPITAL_COMMUNITY)
Admission: RE | Admit: 2015-01-12 | Discharge: 2015-01-12 | Disposition: A | Discharge status: Home / Self Care | Payer: Medicaid Other | Source: Ambulatory Visit | Attending: Nurse Practitioner | Admitting: Nurse Practitioner

## 2015-01-12 ENCOUNTER — Encounter (HOSPITAL_COMMUNITY): Payer: Self-pay

## 2015-01-12 DIAGNOSIS — Z36 Encounter for antenatal screening of mother: Secondary | ICD-10-CM | POA: Insufficient documentation

## 2015-01-12 DIAGNOSIS — Z3689 Encounter for other specified antenatal screening: Secondary | ICD-10-CM

## 2015-01-12 DIAGNOSIS — Z3682 Encounter for antenatal screening for nuchal translucency: Secondary | ICD-10-CM

## 2015-01-18 ENCOUNTER — Other Ambulatory Visit (HOSPITAL_COMMUNITY): Payer: Self-pay | Admitting: Nurse Practitioner

## 2015-02-16 ENCOUNTER — Ambulatory Visit (HOSPITAL_COMMUNITY): Admission: RE | Admit: 2015-02-16 | Payer: Medicaid Other | Source: Ambulatory Visit

## 2015-02-27 ENCOUNTER — Ambulatory Visit (HOSPITAL_COMMUNITY)
Admission: RE | Admit: 2015-02-27 | Discharge: 2015-02-27 | Disposition: A | Discharge status: Home / Self Care | Payer: Medicaid Other | Source: Ambulatory Visit | Attending: Nurse Practitioner | Admitting: Nurse Practitioner

## 2015-02-27 DIAGNOSIS — Z3689 Encounter for other specified antenatal screening: Secondary | ICD-10-CM

## 2015-02-27 DIAGNOSIS — Z36 Encounter for antenatal screening of mother: Secondary | ICD-10-CM | POA: Insufficient documentation

## 2015-06-11 NOTE — L&D Delivery Note (Signed)
Delivery Note  Patient presented sent from clinic for elevated blood pressure at 40 weeks 6 days. Asymptomatic, preE eval wnl, BPs here wnl. Given proximity to post-term, decision made to proceed with induction. Induced with foley, cytotec, arom, and pitocin. Pushed for approximately 20 minutes, no significant fetal heart rate decelerations.  At 5:58 [name] a viable female was delivered via Vaginal, Spontaneous Delivery (Presentation: Right Occiput Anterior).  APGAR: 2, 8; weight 9 lb 9.6 oz (4355 g).   Placenta status: Intact, Manual removal.  Cord: 3 vessels with the following complications: 2 minute 30 second shoulder dystocia. McRoberts and suprapubic pressure not effective. Next rotational maneuver attempted but not effective. Dr. Ashok Pall attempted to deliver the posterior shoulder, and then Dr. Macon Large entered and delivered the posterior shoulder.  Cord pH: 7.374  Anesthesia: Epidural  Episiotomy: None Lacerations: 2nd degree Suture Repair: 3.0 vicryl Est. Blood Loss (mL): 250  Mom to postpartum.  Baby to Nursery.  Madeline [name] Madeline [name], Madeline [name]

## 2015-06-21 LAB — OB RESULTS CONSOLE GC/CHLAMYDIA: Gonorrhea: NEGATIVE

## 2015-06-21 LAB — OB RESULTS CONSOLE GBS: STREP GROUP B AG: NEGATIVE

## 2015-07-08 ENCOUNTER — Emergency Department (HOSPITAL_COMMUNITY)
Admission: EM | Admit: 2015-07-08 | Discharge: 2015-07-09 | Disposition: A | Discharge status: Home / Self Care | Payer: Medicaid Other | Attending: Emergency Medicine | Admitting: Emergency Medicine

## 2015-07-08 ENCOUNTER — Encounter (HOSPITAL_COMMUNITY): Payer: Self-pay | Admitting: Emergency Medicine

## 2015-07-08 DIAGNOSIS — R110 Nausea: Secondary | ICD-10-CM | POA: Insufficient documentation

## 2015-07-08 DIAGNOSIS — Z3A38 38 weeks gestation of pregnancy: Secondary | ICD-10-CM | POA: Insufficient documentation

## 2015-07-08 DIAGNOSIS — F17200 Nicotine dependence, unspecified, uncomplicated: Secondary | ICD-10-CM | POA: Insufficient documentation

## 2015-07-08 DIAGNOSIS — Z79899 Other long term (current) drug therapy: Secondary | ICD-10-CM | POA: Diagnosis not present

## 2015-07-08 DIAGNOSIS — R0600 Dyspnea, unspecified: Secondary | ICD-10-CM | POA: Diagnosis not present

## 2015-07-08 DIAGNOSIS — R079 Chest pain, unspecified: Secondary | ICD-10-CM | POA: Diagnosis not present

## 2015-07-08 DIAGNOSIS — O99333 Smoking (tobacco) complicating pregnancy, third trimester: Secondary | ICD-10-CM | POA: Diagnosis not present

## 2015-07-08 DIAGNOSIS — O9989 Other specified diseases and conditions complicating pregnancy, childbirth and the puerperium: Secondary | ICD-10-CM | POA: Diagnosis not present

## 2015-07-08 HISTORY — DX: Scabies: B86

## 2015-07-08 HISTORY — DX: Papillomavirus as the cause of diseases classified elsewhere: B97.7

## 2015-07-08 LAB — CBC
HEMATOCRIT: 29.5 % — AB (ref 36.0–46.0)
HEMOGLOBIN: 9.6 g/dL — AB (ref 12.0–15.0)
MCH: 24.6 pg — AB (ref 26.0–34.0)
MCHC: 32.5 g/dL (ref 30.0–36.0)
MCV: 75.6 fL — AB (ref 78.0–100.0)
Platelets: 178 10*3/uL (ref 150–400)
RBC: 3.9 MIL/uL (ref 3.87–5.11)
RDW: 15 % (ref 11.5–15.5)
WBC: 9.3 10*3/uL (ref 4.0–10.5)

## 2015-07-08 LAB — I-STAT TROPONIN, ED: Troponin i, poc: 0 ng/mL (ref 0.00–0.08)

## 2015-07-08 LAB — BASIC METABOLIC PANEL
Anion gap: 10 (ref 5–15)
CHLORIDE: 106 mmol/L (ref 101–111)
CO2: 20 mmol/L — AB (ref 22–32)
Calcium: 8.9 mg/dL (ref 8.9–10.3)
Creatinine, Ser: 0.65 mg/dL (ref 0.44–1.00)
GFR calc Af Amer: >60 mL/min (ref >60)
GLUCOSE: 73 mg/dL (ref 65–99)
POTASSIUM: 3.7 mmol/L (ref 3.5–5.1)
Sodium: 136 mmol/L (ref 135–145)

## 2015-07-08 NOTE — ED Notes (Signed)
EDP at bedside  

## 2015-07-08 NOTE — ED Notes (Signed)
Called OB RR, stated en route

## 2015-07-08 NOTE — ED Notes (Signed)
Pt. reports intermittent central chest pain " sharp" with mild SOB and nausea onset this afternoon , denies diaphoresis , pt. stated [redacted] weeks pregnant (G1P0) , denies abdominal cramping or vaginal discharge .

## 2015-07-08 NOTE — ED Provider Notes (Signed)
CSN: [id number]     Arrival date & time 07/08/15  2243 History   First MD Initiated Contact with Patient 07/08/15 2308     Chief Complaint  Patient presents with  . Chest Pain     (Consider location/radiation/quality/duration/timing/severity/associated sxs/prior Treatment) HPI Comments: 20 year old female at G1P0 38 weeks who presents with chest pain. Pt states that a few days ago she had some central, sharp chest pain that spontaneously resolved. This afternoon, she was sleeping when the same chest pain woke her from sleep. She has continued to have sharp episodes of chest pain that last approximately 20 seconds at a time. She has mild shortness of breath and nausea. No vomiting or fevers, no cold symptoms. She states she has had a few brief pains in her left lower side since arrival to her ED room. She denies any vaginal bleeding or discharge.  Patient is a 20 y.o. female presenting with chest pain. The history is provided by the patient.  Chest Pain   History reviewed. No pertinent past medical history. History reviewed. No pertinent past surgical history. No family history on file. Social History  Substance Use Topics  . Smoking status: Current Every Day Smoker  . Smokeless tobacco: None  . Alcohol Use: No   OB History    Gravida Para Term Preterm AB TAB SAB Ectopic Multiple Living   1              Review of Systems  Cardiovascular: Positive for chest pain.   10 Systems reviewed and are negative for acute change except as noted in the HPI.    Allergies  Review of patient's allergies indicates no known allergies.  Home Medications   Prior to Admission medications   Medication Sig Start Date End Date Taking? Authorizing Provider  Prenatal Vit-Fe Fumarate-FA (PRENATAL MULTIVITAMIN) TABS tablet Take 1 tablet by mouth daily at 12 noon.   Yes Historical Provider, MD  metroNIDAZOLE (FLAGYL) 500 MG tablet Take 1 tablet (500 mg total) by mouth 2 (two) times daily. One po bid x  7 days Patient not taking: Reported on 07/08/2015 11/26/14   Rolan Bucco, MD   BP 124/78 mmHg  Pulse 79  Temp(Src) 97.8 F (36.6 C) (Oral)  Resp 19  SpO2 99%  LMP 10/11/2014 Physical Exam  Constitutional: She is oriented to person, place, and time. She appears well-developed and well-nourished. No distress.  HENT:  Head: Normocephalic and atraumatic.  Moist mucous membranes  Eyes: Conjunctivae are normal. Pupils are equal, round, and reactive to light.  Neck: Neck supple.  Cardiovascular: Normal rate, regular rhythm and normal heart sounds.   No murmur heard. Pulmonary/Chest: Breath sounds normal. She has no wheezes.  Mild dyspnea  Abdominal: Soft. Bowel sounds are normal. There is no tenderness.  Gravid uterus w/ fundus just below xiphoid process  Musculoskeletal: She exhibits no edema.  Neurological: She is alert and oriented to person, place, and time.  Fluent speech  Skin: Skin is warm and dry.  Psychiatric: She has a normal mood and affect. Judgment normal.  Nursing note and vitals reviewed.   ED Course  Procedures (including critical care time) Labs Review Labs Reviewed  BASIC METABOLIC PANEL - Abnormal; Notable for the following:    CO2 20 (*)    BUN <5 (*)    All other components within normal limits  CBC - Abnormal; Notable for the following:    Hemoglobin 9.6 (*)    HCT 29.5 (*)    MCV 75.6 (*)  MCH 24.6 (*)    All other components within normal limits  I-STAT TROPOININ, ED    Imaging Review Dg Chest 2 View  07/09/2015  CLINICAL DATA:  Acute onset of generalized chest pain and shortness of breath. Initial encounter. EXAM: CHEST  2 VIEW COMPARISON:  None. FINDINGS: The lungs are well-aerated and clear. There is no evidence of focal opacification, pleural effusion or pneumothorax. The heart is normal in size; the mediastinal contour is within normal limits. No acute osseous abnormalities are seen. IMPRESSION: No acute cardiopulmonary process seen.  Electronically Signed   By: Roanna Raider M.D.   On: 07/09/2015 00:46   I have personally reviewed and evaluated these lab results as part of my medical decision-making.   EKG Interpretation   Date/Time:  Saturday July 08 2015 22:50:00 EST Ventricular Rate:  83 PR Interval:  128 QRS Duration: 78 QT Interval:  340 QTC Calculation: 399 R Axis:   65 Text Interpretation:  Normal sinus rhythm Normal ECG No significant change  since last tracing Confirmed by LITTLE MD, RACHEL ([id number]) on 07/08/2015  11:09:54 PM      MDM   Final diagnoses:  Chest pain, unspecified chest pain type   Braxton Hicks contractions   Pt [redacted] weeks pregnant p/w intermittent central chest pain associated with shortness of breath that has been persistent since this afternoon. On exam, she was well-appearing with reassuring vital signs. No abdominal tenderness to palpation. No abnormal lung sounds. EKG is unremarkable. Basic lab work notable only for anemia with hemoglobin 9.6. CXR negative acute.   Because of the patient's report of intermittent abdominal pain, contacted OB team for evaluation. Patient was placed on fetal heart monitoring and noted to have some contractions without cervical change. She was cleared by the [facility] physician on call as safe for discharge from [facility] standpoint.  Because of the patient's current pregnancy, she is at risk for PE. I have extensively discussed risks of obtaining CTA of the chest including radiation risk and iodine risk to the fetus from contrast. I discussed benefits including ruling out a potentially life-threatening process. The patient and her mother voiced understanding and she elected to proceed with this study. I have ordered CTA of the chest to rule out PE. I'm signing the patient out to the oncoming provider. Her disposition is pending the results of the CT scan.  Laurence Spates, MD 07/09/15 [phone]

## 2015-07-09 ENCOUNTER — Encounter (HOSPITAL_COMMUNITY): Payer: Self-pay

## 2015-07-09 ENCOUNTER — Emergency Department (HOSPITAL_COMMUNITY): Payer: Medicaid Other

## 2015-07-09 MED ORDER — IOHEXOL 350 MG/ML SOLN
100.0000 mL | Freq: Once | INTRAVENOUS | Status: AC | PRN
  Administered 2015-07-09: 100 mL via INTRAVENOUS

## 2015-07-09 NOTE — Progress Notes (Signed)
Patient is reporting RLQ pain with stronger contractions q5min. Patient is contracting 3-27min, but reports no pain, only feeling movement.

## 2015-07-09 NOTE — Progress Notes (Signed)
Called Dr Macon Large, reported pt's reason for ED visit & OB hx, FHR 130's, reactive & reassuring, contracting 3-70min no pain, +FM. Patient stated she was a fingertip in office Thursday 07/06/15, and patient is now 1/thick/-3. Per Dr Macon Large patient is OB cleared, will report to EDP.

## 2015-07-09 NOTE — Progress Notes (Signed)
Patient presented to [facility] ED for chest pain and sharp RLQ pain. She is G1P0 [redacted]w[redacted]d, has been receiving routine PNC since 6wks at [facility]. Patients reports positive fetal movement; denies contraction/cramping pain; denies vaginal bleeding, leaking of fluid, and abnormal discharge.

## 2015-07-09 NOTE — ED Notes (Signed)
OB RR at bedside 

## 2015-07-09 NOTE — ED Notes (Signed)
Pt back from x-ray.

## 2015-07-09 NOTE — ED Notes (Signed)
Pt to xray

## 2015-07-09 NOTE — ED Provider Notes (Signed)
Patient signed out by Dr. Clarene Duke pending CT angio chest. CTA negative for PE. The patient and her mother were updated at bedside. All her questions were answered and she was reassured. [facility] with [facility].  After history, exam, and medical workup I feel the patient has been appropriately medically screened and is safe for discharge home. Pertinent diagnoses were discussed with the patient. Patient was given return precautions.   Results for orders placed or performed during the hospital encounter of 07/08/15  Basic metabolic panel  Result Value Ref Range   Sodium 136 135 - 145 mmol/L   Potassium 3.7 3.5 - 5.1 mmol/L   Chloride 106 101 - 111 mmol/L   CO2 20 (L) 22 - 32 mmol/L   Glucose, Bld 73 65 - 99 mg/dL   BUN <5 (L) 6 - 20 mg/dL   Creatinine, Ser 4.69 0.44 - 1.00 mg/dL   Calcium 8.9 8.9 - 62.9 mg/dL   GFR calc non Af Amer >60 >60 mL/min   GFR calc Af Amer >60 >60 mL/min   Anion gap 10 5 - 15  CBC  Result Value Ref Range   WBC 9.3 4.0 - 10.5 K/uL   RBC 3.90 3.87 - 5.11 MIL/uL   Hemoglobin 9.6 (L) 12.0 - 15.0 g/dL   HCT 52.8 (L) 41.3 - 24.4 %   MCV 75.6 (L) 78.0 - 100.0 fL   MCH 24.6 (L) 26.0 - 34.0 pg   MCHC 32.5 30.0 - 36.0 g/dL   RDW 01.0 27.2 - 53.6 %   Platelets 178 150 - 400 K/uL  I-stat troponin, ED (not at [facility], [facility])  Result Value Ref Range   Troponin i, poc 0.00 0.00 - 0.08 ng/mL   Comment 3           Dg Chest 2 View  07/09/2015  CLINICAL DATA:  Acute onset of generalized chest pain and shortness of breath. Initial encounter. EXAM: CHEST  2 VIEW COMPARISON:  None. FINDINGS: The lungs are well-aerated and clear. There is no evidence of focal opacification, pleural effusion or pneumothorax. The heart is normal in size; the mediastinal contour is within normal limits. No acute osseous abnormalities are seen. IMPRESSION: No acute cardiopulmonary process seen. Electronically Signed   By: Roanna Raider M.D.   On: 07/09/2015 00:46   Ct Angio Chest Pe W/cm &/or Wo  Cm  07/09/2015  CLINICAL DATA:  Acute onset of mid chest pain and shortness of breath. Patient is [redacted] weeks pregnant. Initial encounter. EXAM: CT ANGIOGRAPHY CHEST WITH CONTRAST TECHNIQUE: Multidetector CT imaging of the chest was performed using the standard protocol during bolus administration of intravenous contrast. Multiplanar CT image reconstructions and MIPs were obtained to evaluate the vascular anatomy. CONTRAST:  OMNIPAQUE IOHEXOL 350 MG/ML SOLN COMPARISON:  Chest radiograph performed 07/08/2015 FINDINGS: There is no evidence of pulmonary embolus. Minimal left-sided opacities may reflect atelectasis or possibly mild infection. The right lung appears relatively clear. No pleural effusion or pneumothorax is seen. No masses are identified; no abnormal focal contrast enhancement is seen. The mediastinum is unremarkable in appearance. No mediastinal lymphadenopathy is seen. No pericardial effusion is identified. The great vessels are grossly unremarkable in appearance. No axillary lymphadenopathy is seen. The visualized portions of the thyroid gland are unremarkable in appearance. The visualized portions of the liver and spleen are unremarkable. The edge of the uterine fundus is imaged. The visualized portions of the pancreas, stomach and adrenal glands are grossly unremarkable. No acute osseous abnormalities are seen.  Review of the MIP images confirms the above findings. IMPRESSION: 1. No evidence of pulmonary embolus. 2. Minimal left-sided airspace opacities may reflect atelectasis or possibly mild infection. Electronically Signed   By: Roanna Raider M.D.   On: 07/09/2015 03:09      Shon Baton, MD 07/09/15 [phone]

## 2015-07-09 NOTE — Discharge Instructions (Signed)
Nonspecific Chest Pain  °Chest pain can be caused by many different conditions. There is always a chance that your pain could be related to something serious, such as a heart attack or a blood clot in your lungs. Chest pain can also be caused by conditions that are not life-threatening. If you have chest pain, it is very important to follow up with your health care provider. °CAUSES  °Chest pain can be caused by: °· Heartburn. °· Pneumonia or bronchitis. °· Anxiety or stress. °· Inflammation around your heart (pericarditis) or lung (pleuritis or pleurisy). °· A blood clot in your lung. °· A collapsed lung (pneumothorax). It can develop suddenly on its own (spontaneous pneumothorax) or from trauma to the chest. °· Shingles infection (varicella-zoster virus). °· Heart attack. °· Damage to the bones, muscles, and cartilage that make up your chest wall. This can include: °¨ Bruised bones due to injury. °¨ Strained muscles or cartilage due to frequent or repeated coughing or overwork. °¨ Fracture to one or more ribs. °¨ Sore cartilage due to inflammation (costochondritis). °RISK FACTORS  °Risk factors for chest pain may include: °· Activities that increase your risk for trauma or injury to your chest. °· Respiratory infections or conditions that cause frequent coughing. °· Medical conditions or overeating that can cause heartburn. °· Heart disease or family history of heart disease. °· Conditions or health behaviors that increase your risk of developing a blood clot. °· Having had chicken pox (varicella zoster). °SIGNS AND SYMPTOMS °Chest pain can feel like: °· Burning or tingling on the surface of your chest or deep in your chest. °· Crushing, pressure, aching, or squeezing pain. °· Dull or sharp pain that is worse when you move, cough, or take a deep breath. °· Pain that is also felt in your back, neck, shoulder, or arm, or pain that spreads to any of these areas. °Your chest pain may come and go, or it may stay  constant. °DIAGNOSIS °Lab tests or other studies may be needed to find the cause of your pain. Your health care provider may have you take a test called an ambulatory ECG (electrocardiogram). An ECG records your heartbeat patterns at the time the test is performed. You may also have other tests, such as: °· Transthoracic echocardiogram (TTE). During echocardiography, sound waves are used to create a picture of all of the heart structures and to look at how blood flows through your heart. °· Transesophageal echocardiogram (TEE). This is a more advanced imaging test that obtains images from inside your body. It allows your health care provider to see your heart in finer detail. °· Cardiac monitoring. This allows your health care provider to monitor your heart rate and rhythm in real time. °· Holter monitor. This is a portable device that records your heartbeat and can help to diagnose abnormal heartbeats. It allows your health care provider to track your heart activity for several days, if needed. °· Stress tests. These can be done through exercise or by taking medicine that makes your heart beat more quickly. °· Blood tests. °· Imaging tests. °TREATMENT  °Your treatment depends on what is causing your chest pain. Treatment may include: °· Medicines. These may include: °¨ Acid blockers for heartburn. °¨ Anti-inflammatory medicine. °¨ Pain medicine for inflammatory conditions. °¨ Antibiotic medicine, if an infection is present. °¨ Medicines to dissolve blood clots. °¨ Medicines to treat coronary artery disease. °· Supportive care for conditions that do not require medicines. This may include: °¨ Resting. °¨ Applying heat   or cold packs to injured areas. °¨ Limiting activities until pain decreases. °HOME CARE INSTRUCTIONS °· If you were prescribed an antibiotic medicine, finish it all even if you start to feel better. °· Avoid any activities that bring on chest pain. °· Do not use any tobacco products, including  cigarettes, chewing tobacco, or electronic cigarettes. If you need help quitting, ask your health care provider. °· Do not drink alcohol. °· Take medicines only as directed by your health care provider. °· Keep all follow-up visits as directed by your health care provider. This is important. This includes any further testing if your chest pain does not go away. °· If heartburn is the cause for your chest pain, you may be told to keep your head raised (elevated) while sleeping. This reduces the chance that acid will go from your stomach into your esophagus. °· Make lifestyle changes as directed by your health care provider. These may include: °¨ Getting regular exercise. Ask your health care provider to suggest some activities that are safe for you. °¨ Eating a heart-healthy diet. A registered dietitian can help you to learn healthy eating options. °¨ Maintaining a healthy weight. °¨ Managing diabetes, if necessary. °¨ Reducing stress. °SEEK MEDICAL CARE IF: °· Your chest pain does not go away after treatment. °· You have a rash with blisters on your chest. °· You have a fever. °SEEK IMMEDIATE MEDICAL CARE IF:  °· Your chest pain is worse. °· You have an increasing cough, or you cough up blood. °· You have severe abdominal pain. °· You have severe weakness. °· You faint. °· You have chills. °· You have sudden, unexplained chest discomfort. °· You have sudden, unexplained discomfort in your arms, back, neck, or jaw. °· You have shortness of breath at any time. °· You suddenly start to sweat, or your skin gets clammy. °· You feel nauseous or you vomit. °· You suddenly feel light-headed or dizzy. °· Your heart begins to beat quickly, or it feels like it is skipping beats. °These symptoms may represent a serious problem that is an emergency. Do not wait to see if the symptoms will go away. Get medical help right away. Call your local emergency services (911 in the [place] Do not drive yourself to the hospital. °  °This  information is not intended to replace advice given to you by your health care provider. Make sure you discuss any questions you have with your health care provider. °  °Document Released: 03/06/2005 Document Revised: 06/17/2014 Document Reviewed: 12/31/2013 °Elsevier Interactive Patient Education ©2016 [employer] ° °

## 2015-07-18 ENCOUNTER — Inpatient Hospital Stay (HOSPITAL_COMMUNITY)
Admission: AD | Admit: 2015-07-18 | Discharge: 2015-07-18 | Disposition: A | Discharge status: Home / Self Care | Payer: Medicaid Other | Source: Ambulatory Visit | Attending: Family Medicine | Admitting: Family Medicine

## 2015-07-18 ENCOUNTER — Encounter (HOSPITAL_COMMUNITY): Payer: Self-pay | Admitting: *Deleted

## 2015-07-18 DIAGNOSIS — O471 False labor at or after 37 completed weeks of gestation: Secondary | ICD-10-CM | POA: Diagnosis present

## 2015-07-18 NOTE — Discharge Instructions (Signed)
Keep your scheduled appointment for prenatal care. Drink 8-10 glasses of water per day. °

## 2015-07-19 ENCOUNTER — Encounter (HOSPITAL_COMMUNITY): Payer: Self-pay

## 2015-07-19 ENCOUNTER — Inpatient Hospital Stay (HOSPITAL_COMMUNITY)
Admission: AD | Admit: 2015-07-19 | Discharge: 2015-07-19 | Disposition: A | Discharge status: Home / Self Care | Payer: Medicaid Other | Source: Ambulatory Visit | Attending: Obstetrics & Gynecology | Admitting: Obstetrics & Gynecology

## 2015-07-19 DIAGNOSIS — Z87891 Personal history of nicotine dependence: Secondary | ICD-10-CM | POA: Diagnosis not present

## 2015-07-19 DIAGNOSIS — Z3A40 40 weeks gestation of pregnancy: Secondary | ICD-10-CM | POA: Diagnosis not present

## 2015-07-19 DIAGNOSIS — N898 Other specified noninflammatory disorders of vagina: Secondary | ICD-10-CM | POA: Insufficient documentation

## 2015-07-19 DIAGNOSIS — N760 Acute vaginitis: Secondary | ICD-10-CM

## 2015-07-19 DIAGNOSIS — B9689 Other specified bacterial agents as the cause of diseases classified elsewhere: Secondary | ICD-10-CM

## 2015-07-19 DIAGNOSIS — O4292 Full-term premature rupture of membranes, unspecified as to length of time between rupture and onset of labor: Secondary | ICD-10-CM | POA: Diagnosis present

## 2015-07-19 LAB — WET PREP, GENITAL
SPERM: NONE SEEN
TRICH WET PREP: NONE SEEN
YEAST WET PREP: NONE SEEN

## 2015-07-19 LAB — POCT FERN TEST: POCT FERN TEST: NEGATIVE

## 2015-07-19 MED ORDER — METRONIDAZOLE 500 MG PO TABS: 500.0000 mg | ORAL_TABLET | Freq: Two times a day (BID) | ORAL | Status: DC

## 2015-07-19 NOTE — MAU Note (Signed)
Pt c/o fluid leaking since around 5am. Pt reports feeling two gushes of fluid. Pt denies bleeding. Pt reports occasional contractions.

## 2015-07-19 NOTE — MAU Provider Note (Signed)
  History     CSN: [id number]  Arrival date and time: 07/19/15 1700   First Provider Initiated Contact with Patient 07/19/15 1740      Chief Complaint  Patient presents with  . Rupture of Membranes   HPI  [name] is a 20yo G1 at [redacted]w[redacted]d who reports of LOF at approximately 5-6AM 2/8; she states she has had continued LOF since then. States fluid is "brownish". States she has started having irregular contractions starting about 1.5 hours ago. No vaginal bleeding. Endorses fetal movement.  Patient denies dysuria, increased/abnormal vaginal discharge. No sexual intercourse in the past 24 hours.   Past Medical History  Diagnosis Date  . HPV (human papilloma virus) infection   . Scabies     treated during this pregnancy    Past Surgical History  Procedure Laterality Date  . No past surgeries      Family History  Problem Relation Age of Onset  . Cancer Neg Hx   . Diabetes Neg Hx   . Hypertension Neg Hx     Social History  Substance Use Topics  . Smoking status: Former Games developer  . Smokeless tobacco: Never Used  . Alcohol Use: No    Allergies: No Known Allergies  Prescriptions prior to admission  Medication Sig Dispense Refill Last Dose  . Prenatal Vit-Fe Fumarate-FA (PRENATAL MULTIVITAMIN) TABS tablet Take 1 tablet by mouth daily at 12 noon.   Past Week at Unknown time    ROS Physical Exam   Blood pressure 135/79, pulse 79, temperature 98.3 F (36.8 C), temperature source Oral, resp. rate 17, height  (1.676 m), weight 86.637 kg (191 lb), last menstrual period 10/11/2014, SpO2 100 %.  Physical Exam Speculum exam: no pooling of fluid noted (fern negative x 2), however has significant thin white frothy discharge.  MAU Course  Procedures  MDM No pooling of fluid noted, however thin white frothy discharge noted.  - Wet Prep - Gc/chlamydia  Wet prep consistent with BV. Gc/Chlamydia pending.  FHT reactive. Toco with contractions every 2-6 minutes. Will monitor  and recheck cervix to determine if in labor.  No change in cervix after 1 hour  Assessment and Plan  Will prescribe Flagyl for BV.  Return precautions given. Patient has an appointment in clinic tomorrow (07/20/15)  Palma Holter 07/19/2015, 6:13 PM   Seen by me also Agree with note Discharge home Labor precautions Aviva Signs, CNM

## 2015-07-19 NOTE — Discharge Instructions (Signed)

## 2015-07-20 LAB — GC/CHLAMYDIA PROBE AMP (~~LOC~~) NOT AT ARMC
CHLAMYDIA, DNA PROBE: NEGATIVE
Neisseria Gonorrhea: NEGATIVE

## 2015-07-24 ENCOUNTER — Encounter (HOSPITAL_COMMUNITY): Payer: Self-pay | Admitting: Obstetrics

## 2015-07-24 ENCOUNTER — Inpatient Hospital Stay (HOSPITAL_COMMUNITY): Payer: Medicaid Other | Admitting: Anesthesiology

## 2015-07-24 ENCOUNTER — Inpatient Hospital Stay (HOSPITAL_COMMUNITY)
Admission: AD | Admit: 2015-07-24 | Discharge: 2015-07-27 | DRG: 775 | Disposition: A | Discharge status: Home / Self Care | Payer: Medicaid Other | Source: Ambulatory Visit | Attending: Family Medicine | Admitting: Family Medicine

## 2015-07-24 DIAGNOSIS — O163 Unspecified maternal hypertension, third trimester: Secondary | ICD-10-CM | POA: Diagnosis present

## 2015-07-24 DIAGNOSIS — Z3A40 40 weeks gestation of pregnancy: Secondary | ICD-10-CM

## 2015-07-24 DIAGNOSIS — O480 Post-term pregnancy: Principal | ICD-10-CM | POA: Diagnosis present

## 2015-07-24 DIAGNOSIS — R030 Elevated blood-pressure reading, without diagnosis of hypertension: Secondary | ICD-10-CM | POA: Diagnosis present

## 2015-07-24 DIAGNOSIS — O169 Unspecified maternal hypertension, unspecified trimester: Secondary | ICD-10-CM

## 2015-07-24 DIAGNOSIS — Z87891 Personal history of nicotine dependence: Secondary | ICD-10-CM | POA: Diagnosis not present

## 2015-07-24 DIAGNOSIS — IMO0002 Reserved for concepts with insufficient information to code with codable children: Secondary | ICD-10-CM

## 2015-07-24 LAB — CBC
HCT: 29.7 % — ABNORMAL LOW (ref 36.0–46.0)
Hemoglobin: 9.5 g/dL — ABNORMAL LOW (ref 12.0–15.0)
MCH: 23.6 pg — ABNORMAL LOW (ref 26.0–34.0)
MCHC: 32 g/dL (ref 30.0–36.0)
MCV: 73.7 fL — ABNORMAL LOW (ref 78.0–100.0)
Platelets: 201 K/uL (ref 150–400)
RBC: 4.03 MIL/uL (ref 3.87–5.11)
RDW: 16.2 % — ABNORMAL HIGH (ref 11.5–15.5)
WBC: 8.5 K/uL (ref 4.0–10.5)

## 2015-07-24 LAB — COMPREHENSIVE METABOLIC PANEL
ALBUMIN: 2.8 g/dL — AB (ref 3.5–5.0)
ALK PHOS: 139 U/L — AB (ref 38–126)
ALT: 11 U/L — AB (ref 14–54)
AST: 24 U/L (ref 15–41)
Anion gap: 9 (ref 5–15)
BUN: 6 mg/dL (ref 6–20)
CALCIUM: 8.7 mg/dL — AB (ref 8.9–10.3)
CHLORIDE: 107 mmol/L (ref 101–111)
CO2: 21 mmol/L — AB (ref 22–32)
CREATININE: 0.7 mg/dL (ref 0.44–1.00)
GFR calc non Af Amer: >60 mL/min (ref >60)
GLUCOSE: 70 mg/dL (ref 65–99)
Potassium: 3.7 mmol/L (ref 3.5–5.1)
SODIUM: 137 mmol/L (ref 135–145)
Total Bilirubin: 0.6 mg/dL (ref 0.3–1.2)
Total Protein: 6.2 g/dL — ABNORMAL LOW (ref 6.5–8.1)

## 2015-07-24 LAB — TYPE AND SCREEN
ABO/RH(D): B POS
ANTIBODY SCREEN: NEGATIVE

## 2015-07-24 LAB — PROTEIN / CREATININE RATIO, URINE
Creatinine, Urine: 362 mg/dL
Protein Creatinine Ratio: 0.15 mg/mg{Cre} (ref 0.00–0.15)
TOTAL PROTEIN, URINE: 55 mg/dL

## 2015-07-24 MED ORDER — OXYTOCIN BOLUS FROM INFUSION
500.0000 mL | INTRAVENOUS | Status: DC
  Administered 2015-07-25: 500 mL via INTRAVENOUS

## 2015-07-24 MED ORDER — OXYCODONE-ACETAMINOPHEN 5-325 MG PO TABS: 1.0000 | ORAL_TABLET | ORAL | Status: DC | PRN

## 2015-07-24 MED ORDER — OXYCODONE-ACETAMINOPHEN 5-325 MG PO TABS: 2.0000 | ORAL_TABLET | ORAL | Status: DC | PRN

## 2015-07-24 MED ORDER — PHENYLEPHRINE 40 MCG/ML (10ML) SYRINGE FOR IV PUSH (FOR BLOOD PRESSURE SUPPORT)
80.0000 ug | PREFILLED_SYRINGE | INTRAVENOUS | Status: DC | PRN
  Filled 2015-07-24: qty 2

## 2015-07-24 MED ORDER — MISOPROSTOL 25 MCG QUARTER TABLET
25.0000 ug | ORAL_TABLET | ORAL | Status: DC | PRN
  Administered 2015-07-24: 25 ug via VAGINAL
  Filled 2015-07-24: qty 1
  Filled 2015-07-24: qty 0.25

## 2015-07-24 MED ORDER — TERBUTALINE SULFATE 1 MG/ML IJ SOLN
0.2500 mg | Freq: Once | INTRAMUSCULAR | Status: DC | PRN
  Filled 2015-07-24: qty 1

## 2015-07-24 MED ORDER — EPHEDRINE 5 MG/ML INJ
10.0000 mg | INTRAVENOUS | Status: DC | PRN
  Filled 2015-07-24: qty 2

## 2015-07-24 MED ORDER — DIPHENHYDRAMINE HCL 50 MG/ML IJ SOLN: 12.5000 mg | INTRAMUSCULAR | Status: DC | PRN

## 2015-07-24 MED ORDER — FENTANYL CITRATE (PF) 100 MCG/2ML IJ SOLN
100.0000 ug | INTRAMUSCULAR | Status: DC | PRN
  Administered 2015-07-24: 100 ug via INTRAVENOUS
  Filled 2015-07-24: qty 2

## 2015-07-24 MED ORDER — CITRIC ACID-SODIUM CITRATE 334-500 MG/5ML PO SOLN
30.0000 mL | ORAL | Status: DC | PRN
  Filled 2015-07-24: qty 15

## 2015-07-24 MED ORDER — LIDOCAINE HCL (PF) 1 % IJ SOLN
30.0000 mL | INTRAMUSCULAR | Status: DC | PRN
  Filled 2015-07-24: qty 30

## 2015-07-24 MED ORDER — PHENYLEPHRINE 40 MCG/ML (10ML) SYRINGE FOR IV PUSH (FOR BLOOD PRESSURE SUPPORT)
PREFILLED_SYRINGE | INTRAVENOUS | Status: AC
  Filled 2015-07-24: qty 20

## 2015-07-24 MED ORDER — LACTATED RINGERS IV SOLN
500.0000 mL | Freq: Once | INTRAVENOUS | Status: AC
Start: 2015-07-24 — End: 2015-07-24
  Administered 2015-07-24: 500 mL via INTRAVENOUS

## 2015-07-24 MED ORDER — LACTATED RINGERS IV SOLN: 500.0000 mL | Freq: Once | INTRAVENOUS | Status: DC

## 2015-07-24 MED ORDER — OXYTOCIN 10 UNIT/ML IJ SOLN
2.5000 [IU]/h | INTRAVENOUS | Status: DC
  Administered 2015-07-25: 2.5 [IU]/h via INTRAVENOUS
  Filled 2015-07-24: qty 4

## 2015-07-24 MED ORDER — ONDANSETRON HCL 4 MG/2ML IJ SOLN
4.0000 mg | Freq: Four times a day (QID) | INTRAMUSCULAR | Status: DC | PRN
  Administered 2015-07-25: 4 mg via INTRAVENOUS
  Filled 2015-07-24: qty 2

## 2015-07-24 MED ORDER — ACETAMINOPHEN 325 MG PO TABS: 650.0000 mg | ORAL_TABLET | ORAL | Status: DC | PRN

## 2015-07-24 MED ORDER — FENTANYL 2.5 MCG/ML BUPIVACAINE 1/10 % EPIDURAL INFUSION (WH - ANES)
INTRAMUSCULAR | Status: AC
  Administered 2015-07-24: 14 mL/h via EPIDURAL
  Filled 2015-07-24: qty 125

## 2015-07-24 MED ORDER — FENTANYL 2.5 MCG/ML BUPIVACAINE 1/10 % EPIDURAL INFUSION (WH - ANES)
14.0000 mL/h | INTRAMUSCULAR | Status: DC | PRN
  Administered 2015-07-24 – 2015-07-25 (×4): 14 mL/h via EPIDURAL
  Filled 2015-07-24 (×3): qty 125

## 2015-07-24 MED ORDER — LACTATED RINGERS IV SOLN
INTRAVENOUS | Status: DC
  Administered 2015-07-24 – 2015-07-25 (×2): via INTRAVENOUS

## 2015-07-24 MED ORDER — LACTATED RINGERS IV SOLN: 500.0000 mL | INTRAVENOUS | Status: DC | PRN

## 2015-07-24 MED ORDER — LIDOCAINE HCL (PF) 1 % IJ SOLN
INTRAMUSCULAR | Status: DC | PRN
  Administered 2015-07-24: 5 mL via EPIDURAL
  Administered 2015-07-24: 3 mL via EPIDURAL
  Administered 2015-07-24: 2 mL via EPIDURAL

## 2015-07-24 NOTE — Anesthesia Procedure Notes (Signed)
Epidural Patient location during procedure: OB  Staffing Anesthesiologist: Aimar Borghi EDWARD Performed by: anesthesiologist   Preanesthetic Checklist Completed: patient identified, pre-op evaluation, timeout performed, IV checked, risks and benefits discussed and monitors and equipment checked  Epidural Patient position: sitting Prep: DuraPrep Patient monitoring: blood pressure and continuous pulse ox Approach: midline Location: L3-L4 Injection technique: LOR air  Needle:  Needle type: Tuohy  Needle gauge: 17 G Needle length: 9 cm Needle insertion depth: 4 cm Catheter size: 19 Gauge Catheter at skin depth: 9 cm Test dose: negative and Other (1% Lidocaine)  Additional Notes Patient identified.  Risk benefits discussed including failed block, incomplete pain control, headache, nerve damage, paralysis, blood pressure changes, nausea, vomiting, reactions to medication both toxic or allergic, and postpartum back pain.  Patient expressed understanding and wished to proceed.  All questions were answered.  Sterile technique used throughout procedure and epidural site dressed with sterile barrier dressing. No paresthesia or other complications noted. The patient did not experience any signs of intravascular injection such as tinnitus or metallic taste in mouth nor signs of intrathecal spread such as rapid motor block. Please see nursing notes for vital signs. Reason for block:procedure for pain   

## 2015-07-24 NOTE — H&P (Signed)
LABOR AND DELIVERY ADMISSION HISTORY AND PHYSICAL NOTE  Madeline [name] is a 20 y.o. female G1P0 with IUP at [redacted]w[redacted]d by L/[redacted]w[redacted]d [place] presenting for IOL 2/2 concern for gHTN and PDIOL. Pregnancy c/b concern for elevated blood pressure, scabies (treated 12/2014), chlamydia (treated with reported negative TOC), and ASCUS/+HPV on pap smear.   Per report patient had elevated blood pressure at the health department in the 150s/90s, however normal on exam here. Denies any history of HTN prior to pregnancy or during. She has been taking her PNV, and was recently started on flagyl for vaginal discharge.   She denies any headache, endorses mild blurry VA, denies RUQ pain, has had mild swelling of hands and feet throughout pregnancy.   Denies any substance use during this pregnancy. She is having a girl. Plans to breast feed.   She reports positive fetal movement. She denies leakage of fluid or vaginal bleeding. She is feeling her contractions, this has been happening intermittently. She had some mild whitish discharge yesterday  Prenatal History/Complications:  Past Medical History: Past Medical History  Diagnosis Date  . HPV (human papilloma virus) infection   . Scabies     treated during this pregnancy    Past Surgical History: Past Surgical History  Procedure Laterality Date  . No past surgeries      Obstetrical History: OB History    Gravida Para Term Preterm AB TAB SAB Ectopic Multiple Living   1         0      Social History: Social History   Social History  . Marital Status: Single    Spouse Name: N/A  . Number of Children: N/A  . Years of Education: N/A   Social History Main Topics  . Smoking status: Former Games developer  . Smokeless tobacco: Never Used  . Alcohol Use: No  . Drug Use: No  . Sexual Activity: Yes    Birth Control/ Protection: None   Other Topics Concern  . None   Social History Narrative    Family History: Family History  Problem Relation Age of Onset  .  Cancer Neg Hx   . Diabetes Neg Hx   . Hypertension Neg Hx     Allergies: No Known Allergies  Prescriptions prior to admission  Medication Sig Dispense Refill Last Dose  . metroNIDAZOLE (FLAGYL) 500 MG tablet Take 1 tablet (500 mg total) by mouth 2 (two) times daily. 14 tablet 0 07/24/2015 at Unknown time  . Prenatal Vit-Fe Fumarate-FA (PRENATAL MULTIVITAMIN) TABS tablet Take 1 tablet by mouth daily at 12 noon.   07/24/2015 at Unknown time     Review of Systems   All systems reviewed and negative except as stated in HPI  Blood pressure 132/76, pulse 71, temperature 98.9 F (37.2 C), temperature source Oral, resp. rate 18, height  (1.676 m), weight 199 lb (90.266 kg), last menstrual period 10/11/2014. General appearance: alert, cooperative and appears stated age Lungs: clear to auscultation bilaterally Heart: regular rate and rhythm Abdomen: soft, non-tender; bowel sounds normal Extremities: No calf swelling or tenderness Presentation: cephalic Fetal monitoring: category I, +acels/no decels/moderate variability Uterine activity: q5 minutes Dilation: 3 Effacement (%): 30 Station: -3, -2 Exam by:: Eglitis MD   Prenatal labs: ABO, Rh: B/Positive/-- (07/28 0000) Antibody: Negative (07/28 0000) Rubella: immune RPR: Nonreactive (07/28 0000)  HBsAg: Negative (07/28 0000)  HIV: Non-reactive (07/28 0000)  GBS: Negative (01/11 0000)  1 hr Glucola: 101 Genetic screening:  Negative Anatomy US: Normal  Prenatal Transfer Tool  Maternal Diabetes: No Genetic Screening: Normal Maternal Ultrasounds/Referrals: Normal Fetal Ultrasounds or other Referrals:  None Maternal Substance Abuse:  No Significant Maternal Medications:  None Significant Maternal Lab Results: None  Results for orders placed or performed during the hospital encounter of 07/24/15 (from the past 24 hour(s))  CBC   Collection Time: 07/24/15  4:50 PM  Result Value Ref Range   WBC 8.5 4.0 - 10.5 K/uL   RBC  4.03 3.87 - 5.11 MIL/uL   Hemoglobin 9.5 (L) 12.0 - 15.0 g/dL   HCT 19.1 (L) 47.8 - 29.5 %   MCV 73.7 (L) 78.0 - 100.0 fL   MCH 23.6 (L) 26.0 - 34.0 pg   MCHC 32.0 30.0 - 36.0 g/dL   RDW 62.1 (H) 30.8 - 65.7 %   Platelets 201 150 - 400 K/uL  Comprehensive metabolic panel   Collection Time: 07/24/15  4:50 PM  Result Value Ref Range   Sodium 137 135 - 145 mmol/L   Potassium 3.7 3.5 - 5.1 mmol/L   Chloride 107 101 - 111 mmol/L   CO2 21 (L) 22 - 32 mmol/L   Glucose, Bld 70 65 - 99 mg/dL   BUN 6 6 - 20 mg/dL   Creatinine, Ser 8.46 0.44 - 1.00 mg/dL   Calcium 8.7 (L) 8.9 - 10.3 mg/dL   Total Protein 6.2 (L) 6.5 - 8.1 g/dL   Albumin 2.8 (L) 3.5 - 5.0 g/dL   AST 24 15 - 41 U/L   ALT 11 (L) 14 - 54 U/L   Alkaline Phosphatase 139 (H) 38 - 126 U/L   Total Bilirubin 0.6 0.3 - 1.2 mg/dL   GFR calc non Af Amer >60 >60 mL/min   GFR calc Af Amer >60 >60 mL/min   Anion gap 9 5 - 15    Patient Active Problem List   Diagnosis Date Noted  . Elevated blood pressure affecting pregnancy, antepartum 07/24/2015  . Elevated blood pressure affecting pregnancy in third trimester, antepartum 07/24/2015    Assessment: Madeline [name] is a 20 y.o. G1P0 at [redacted]w[redacted]d here for elevated blood pressure, PDIOL and early labor. Her pregnancy has been complicated by +chlamydia - treated with negative TOC, and ASCUS +HPV pap. Her BP was elevated to the 150s/90s in clinic, blood pressures here have been negative. Though she does not technically meet criteria for gHTN given she does not have BP elevated 4 hours apart, she is only 1 day shy of 41 weeks, and with contractions recommend proceeding with IOL.   #Labor: plan for cytotec +foley bulb  - 1730 3/30/-2 FB placed,  - 1800 Cytotec placed  #Pain: Open to options at this point including epidural  #FWB: Category I #ID:  GBS negative, +BV on flagyl currently will continue #Blood pressure: No history of cHTN or gHTN. Asymptomatic. Blood pressure on admission has  been normal. Will continue to monitor. HELLP labs pending.  #MOF: breast #MOC:Nexplanon   Autumn Eglitis 07/24/2015, 5:47 PM   OB FELLOW HISTORY AND PHYSICAL ATTESTATION  I have seen and examined this patient; I agree with above documentation in the resident's note.    Cherrie Gauze Demarqus Jocson 07/24/2015, 6:14 PM

## 2015-07-24 NOTE — Consults (Signed)
  Anesthesia Pain Consult Note  Patient: Madeline [name], 20 y.o., female  Consult Requested by: Levie Heritage, DO  Reason for Consult: CRNA pain rounds  Level of Consciousness: alert  Pain:0, pain goal 7 desires natural birth    [facility] 07/24/2015

## 2015-07-24 NOTE — Progress Notes (Signed)
Labor Progress Note  Madeline [name] is a 20 y.o. G1P0 at [redacted]w[redacted]d  admitted for induction of labor due to elevated BP in clinic. BP stable   S:  No concerns or questions currently. Doing well. No HA, visual changes, or RUQ pain   O:  BP 129/73 mmHg  Pulse 73  Temp(Src) 98.9 F (37.2 C) (Oral)  Resp 20  Ht  (1.676 m)  Wt 90.266 kg (199 lb)  BMI 32.13 kg/m2  LMP 10/11/2014     FHT:  FHR: 130-135 bpm, variability: moderate,  accelerations:  Present,  decelerations:  Absent UC:  Irregular on toco SVE:   Dilation: 3 Effacement (%): 30 Station: -3, -2 Exam by:: Eglitis MD Intact    Labs: Lab Results  Component Value Date   WBC 8.5 07/24/2015   HGB 9.5* 07/24/2015   HCT 29.7* 07/24/2015   MCV 73.7* 07/24/2015   PLT 201 07/24/2015    Assessment / Plan: 20 y.o. G1P0 [redacted]w[redacted]d IOL for elevated BP in clinic. BP stable. Pre-e labs wnl  Labor: IOL. FB Fetal Wellbeing:  Cat 1 Pain Control:  PRN IV Meds; patient desires eventual epidural  Anticipated MOD:  NSVD   Palma Holter, MD PGY1 Family Medicine

## 2015-07-24 NOTE — Anesthesia Preprocedure Evaluation (Addendum)
Anesthesia Evaluation  Patient identified by MRN, date of birth, ID band Patient awake    Reviewed: Allergy & Precautions, NPO status , Patient's Chart, lab work & pertinent test results  Airway Mallampati: II  TM Distance: >3 FB Neck ROM: Full    Dental  (+) Teeth Intact, Dental Advisory Given   Pulmonary former smoker,    Pulmonary exam normal breath sounds clear to auscultation       Cardiovascular hypertension (PIH, no medications), Normal cardiovascular exam Rhythm:Regular Rate:Normal     Neuro/Psych negative neurological ROS  negative psych ROS   GI/Hepatic negative GI ROS, Neg liver ROS,   Endo/Other  Obesity   Renal/GU negative Renal ROS     Musculoskeletal negative musculoskeletal ROS (+)   Abdominal   Peds  Hematology  (+) Blood dyscrasia, anemia , Plt 201k   Anesthesia Other Findings Day of surgery medications reviewed with the patient.  Reproductive/Obstetrics (+) Pregnancy G1 with PIH                             Anesthesia Physical Anesthesia Plan  ASA: II  Anesthesia Plan: Epidural   Post-op Pain Management:    Induction:   Airway Management Planned:   Additional Equipment:   Intra-op Plan:   Post-operative Plan:   Informed Consent: I have reviewed the patients History and Physical, chart, labs and discussed the procedure including the risks, benefits and alternatives for the proposed anesthesia with the patient or authorized representative who has indicated his/her understanding and acceptance.   Dental advisory given  Plan Discussed with:   Anesthesia Plan Comments: (Patient identified. Risks/Benefits/Options discussed with patient including but not limited to bleeding, infection, nerve damage, paralysis, failed block, incomplete pain control, headache, blood pressure changes, nausea, vomiting, reactions to medication both or allergic, itching and  postpartum back pain. Confirmed with bedside nurse the patient's most recent platelet count. Confirmed with patient that they are not currently taking any anticoagulation, have any bleeding history or any family history of bleeding disorders. Patient expressed understanding and wished to proceed. All questions were answered. )        Anesthesia Quick Evaluation

## 2015-07-25 ENCOUNTER — Encounter (HOSPITAL_COMMUNITY): Payer: Self-pay | Admitting: *Deleted

## 2015-07-25 DIAGNOSIS — IMO0002 Reserved for concepts with insufficient information to code with codable children: Secondary | ICD-10-CM

## 2015-07-25 DIAGNOSIS — Z3A40 40 weeks gestation of pregnancy: Secondary | ICD-10-CM

## 2015-07-25 DIAGNOSIS — Z87891 Personal history of nicotine dependence: Secondary | ICD-10-CM

## 2015-07-25 DIAGNOSIS — R030 Elevated blood-pressure reading, without diagnosis of hypertension: Secondary | ICD-10-CM

## 2015-07-25 DIAGNOSIS — O480 Post-term pregnancy: Secondary | ICD-10-CM

## 2015-07-25 LAB — ABO/RH: ABO/RH(D): B POS

## 2015-07-25 LAB — RPR: RPR Ser Ql: NONREACTIVE

## 2015-07-25 MED ORDER — ONDANSETRON HCL 4 MG PO TABS: 4.0000 mg | ORAL_TABLET | ORAL | Status: DC | PRN

## 2015-07-25 MED ORDER — METRONIDAZOLE 500 MG PO TABS
500.0000 mg | ORAL_TABLET | Freq: Two times a day (BID) | ORAL | Status: DC
  Administered 2015-07-25 – 2015-07-26 (×3): 500 mg via ORAL
  Filled 2015-07-25 (×6): qty 1

## 2015-07-25 MED ORDER — ONDANSETRON HCL 4 MG/2ML IJ SOLN: 4.0000 mg | INTRAMUSCULAR | Status: DC | PRN

## 2015-07-25 MED ORDER — TERBUTALINE SULFATE 1 MG/ML IJ SOLN
0.2500 mg | Freq: Once | INTRAMUSCULAR | Status: DC | PRN
  Filled 2015-07-25: qty 1

## 2015-07-25 MED ORDER — WITCH HAZEL-GLYCERIN EX PADS: 1.0000 "application " | MEDICATED_PAD | CUTANEOUS | Status: DC | PRN

## 2015-07-25 MED ORDER — BENZOCAINE-MENTHOL 20-0.5 % EX AERO
1.0000 "application " | INHALATION_SPRAY | CUTANEOUS | Status: DC | PRN
  Administered 2015-07-25: 1 via TOPICAL
  Filled 2015-07-25: qty 56

## 2015-07-25 MED ORDER — SIMETHICONE 80 MG PO CHEW: 80.0000 mg | CHEWABLE_TABLET | ORAL | Status: DC | PRN

## 2015-07-25 MED ORDER — PRENATAL MULTIVITAMIN CH
1.0000 | ORAL_TABLET | Freq: Every day | ORAL | Status: DC
  Administered 2015-07-26: 1 via ORAL
  Filled 2015-07-25: qty 1

## 2015-07-25 MED ORDER — ZOLPIDEM TARTRATE 5 MG PO TABS: 5.0000 mg | ORAL_TABLET | Freq: Every evening | ORAL | Status: DC | PRN

## 2015-07-25 MED ORDER — LANOLIN HYDROUS EX OINT: TOPICAL_OINTMENT | CUTANEOUS | Status: DC | PRN

## 2015-07-25 MED ORDER — DIPHENHYDRAMINE HCL 25 MG PO CAPS: 25.0000 mg | ORAL_CAPSULE | Freq: Four times a day (QID) | ORAL | Status: DC | PRN

## 2015-07-25 MED ORDER — IBUPROFEN 600 MG PO TABS
600.0000 mg | ORAL_TABLET | Freq: Four times a day (QID) | ORAL | Status: DC
  Administered 2015-07-25 – 2015-07-27 (×6): 600 mg via ORAL
  Filled 2015-07-25 (×6): qty 1

## 2015-07-25 MED ORDER — OXYTOCIN 10 UNIT/ML IJ SOLN
1.0000 m[IU]/min | INTRAMUSCULAR | Status: DC
  Administered 2015-07-25: 4 m[IU]/min via INTRAVENOUS
  Administered 2015-07-25: 6 m[IU]/min via INTRAVENOUS
  Administered 2015-07-25: 2 m[IU]/min via INTRAVENOUS

## 2015-07-25 MED ORDER — MEASLES, MUMPS & RUBELLA VAC ~~LOC~~ INJ: 0.5000 mL | INJECTION | Freq: Once | SUBCUTANEOUS | Status: DC

## 2015-07-25 MED ORDER — DIBUCAINE 1 % RE OINT
1.0000 | TOPICAL_OINTMENT | RECTAL | Status: DC | PRN
Start: 2015-07-25 — End: 2015-07-27

## 2015-07-25 MED ORDER — ACETAMINOPHEN 325 MG PO TABS: 650.0000 mg | ORAL_TABLET | ORAL | Status: DC | PRN

## 2015-07-25 MED ORDER — SENNOSIDES-DOCUSATE SODIUM 8.6-50 MG PO TABS
2.0000 | ORAL_TABLET | ORAL | Status: DC
  Administered 2015-07-25 – 2015-07-26 (×2): 2 via ORAL
  Filled 2015-07-25 (×2): qty 2

## 2015-07-25 MED ORDER — TETANUS-DIPHTH-ACELL PERTUSSIS 5-2.5-18.5 LF-MCG/0.5 IM SUSP: 0.5000 mL | Freq: Once | INTRAMUSCULAR | Status: DC

## 2015-07-25 NOTE — Lactation Note (Signed)
This note was copied from a baby's chart. Lactation Consultation Note  Patient Name: Madeline [name] [id number] Date: 07/25/2015 Reason for consult: Initial assessment Baby at 4 hr of life and mom is not sure baby is latching well. Baby was sleeping and mom is really tires. Discussed baby behavior, feeding frequency, baby belly size, voids, wt loss, breast changes, and nipple care. Demonstrated manual expression, colostrum noted bilaterally, spoon in room. Given lactation handouts. Aware of OP services and support group. Mom may need more bf education. She will call as needed for latch help.      Maternal Data Has patient been taught Hand Expression?: Yes Does the patient have breastfeeding experience prior to this delivery?: No  Feeding Feeding Type: Breast Fed  LATCH Score/Interventions Latch: Too sleepy or reluctant, no latch achieved, no sucking elicited. Intervention(s): Teach feeding cues;Skin to skin                    Lactation Tools Discussed/Used WIC Program: Yes   Consult Status Consult Status: Follow-up Date: 07/26/15 Follow-up type: In-patient    Rulon Eisenmenger 07/25/2015, 10:44 PM

## 2015-07-25 NOTE — Consult Note (Signed)
Neonatology Note:  Attendance at Code Apgar:   Our team responded to a Code Apgar call to room # 169 following NSVD, due shoulder dystocia in progress. The requesting physicians were Drs. Stinson and Anyanwu. The mother is a G1P0 B pos, GBS neg with elevated BP, but otherwise uncomplicated pregnancy. ROM occurred 8 hours PTD and the fluid was clear. There had been no fetal distress and labor had not been protracted.  At delivery, there was shoulder dystocia for 3 minutes. The baby was pale, limp, and apneic at birth, but with rapid HR. I bulb suctioned for some clear secretions, gave stimulation without response, so applied PPV. The baby took a gasp at 1 min, and another about 20 seconds later. PPV was given for 1.5 minutes, then we gave stimulation again, and the baby cried at 2 minutes. Color and perfusion were good, HR was about 190-200, face pale and bruised. Muscle tone returned gradually and was normal by 10 minutes. We had placed a pulse oximeter and the O2 saturations were 89-90% at 6 minutes, so I gave BBO2 for about 1-2 minutes, then withdrew it, after which the O2 saturations stayed in the 91-93% range. Lungs clear, no distress. Ap 2/8/9.  I spoke with the mother in the DR, then transferred the baby to the Pediatrician's care. I asked the transition nurse to observe the baby and to move her to the CN to complete transition if her O2 saturations were not within expected parameters.  Doretha Sou, MD

## 2015-07-25 NOTE — Progress Notes (Signed)
LABOR PROGRESS NOTE  Madeline [name] is a 20 y.o. G1P0 at [redacted]w[redacted]d  admitted for elevated BP in clinic. Here bp wnl, now post-dates. Inducing  Subjective: Mild pain w/ contractions  Objective: BP 123/69 mmHg  Pulse 72  Temp(Src) 98.3 F (36.8 C) (Oral)  Resp 20  Ht  (1.676 m)  Wt 199 lb (90.266 kg)  BMI 32.13 kg/m2  SpO2 96%  LMP 10/11/2014 or  Filed Vitals:   07/25/15 0827 07/25/15 0830 07/25/15 0901 07/25/15 0930  BP:  137/80 114/67 123/69  Pulse: 73 90 91 72  Temp:   98.3 F (36.8 C)   TempSrc:   Oral   Resp:   20   Height:      Weight:      SpO2: 96%       135/mod/+a/-d  Dilation: 6.5 Effacement (%): 80 Cervical Position: Middle Station: -2 Presentation: Vertex Exam by:: Dr. Ashok Pall  Labs: Lab Results  Component Value Date   WBC 8.5 07/24/2015   HGB 9.5* 07/24/2015   HCT 29.7* 07/24/2015   MCV 73.7* 07/24/2015   PLT 201 07/24/2015    Patient Active Problem List   Diagnosis Date Noted  . Elevated blood pressure affecting pregnancy, antepartum 07/24/2015  . Elevated blood pressure affecting pregnancy in third trimester, antepartum 07/24/2015    Assessment / Plan: 20 y.o. G1P0 at [redacted]w[redacted]d here for the above.  Labor: now s/p arom clear just now at 09:30. Contracting q 3-4. Will augment if needed, no pitocin currently Fetal Wellbeing:  Cat 1 Pain Control:  epidural Anticipated MOD:  vaginal  Silvano Bilis, MD 07/25/2015, 9:38 AM

## 2015-07-26 NOTE — Anesthesia Postprocedure Evaluation (Addendum)
Anesthesia Post Note  Patient: Madeline [name]  Procedure(s) Performed: * No procedures listed *  Patient location during evaluation: Mother Baby Anesthesia Type: Epidural Level of consciousness: awake and alert and oriented Pain management: pain level controlled (pain controlled per patient) Vital Signs Assessment: post-procedure vital signs reviewed and stable Respiratory status: spontaneous breathing Cardiovascular status: blood pressure returned to baseline Postop Assessment: no backache, no headache, patient able to bend at knees, no signs of nausea or vomiting and adequate PO intake Anesthetic complications: no    Last Vitals:  Filed Vitals:   07/26/15 0246 07/26/15 0549  BP: 118/75 112/64  Pulse: 87 77  Temp: 36.8 C 36.6 C  Resp: 20 18    Last Pain:  Filed Vitals:   07/26/15 0706  PainSc: Asleep                 Karman Biswell

## 2015-07-26 NOTE — Progress Notes (Signed)
UR chart review completed.  

## 2015-07-26 NOTE — Lactation Note (Signed)
This note was copied from a baby's chart. Lactation Consultation Note  Patient Name: [name] Date: 07/26/2015 Reason for consult: Follow-up assessment Baby has been sleepy and not wanting to latch today. Mom reports baby has latched few times to right breast. Both nipples are slightly flat but very compressible. Baby is tongue thrusting when attempting to latch. Demonstrated suck training to Mom and jaw massage. After several attempts and breast compression was able to get baby to latch and sustain a good suckling pattern. Advised Mom to continue to BF with feeding ques, 8-12 times or more in 24 hours, use breast compression to help baby latch.  Try to keep baby nursing for 15-20 minutes both breasts some feedings. Cluster feeding discussed. Encouraged to call for assist as needed.   Maternal Data    Feeding Feeding Type: Breast Fed Length of feed: 0 min  LATCH Score/Interventions Latch: Repeated attempts needed to sustain latch, nipple held in mouth throughout feeding, stimulation needed to elicit sucking reflex. Intervention(s): Skin to skin;Teach feeding cues;Waking techniques Intervention(s): Adjust position;Assist with latch;Breast massage;Breast compression  Audible Swallowing: A few with stimulation Intervention(s): Skin to skin  Type of Nipple: Flat (erect w/stimulation, compressible)  Comfort (Breast/Nipple): Soft / non-tender     Hold (Positioning): Assistance needed to correctly position infant at breast and maintain latch. Intervention(s): Breastfeeding basics reviewed;Support Pillows;Position options;Skin to skin  LATCH Score: 6  Lactation Tools Discussed/Used     Consult Status Consult Status: Follow-up Date: 07/27/15 Follow-up type: In-patient    Alfred Levins 07/26/2015, 3:01 PM

## 2015-07-26 NOTE — Progress Notes (Signed)
Post Partum Day 1  Subjective:  Madeline [name] is a 20 y.o. G1P1001 110w0d s/p Induced vaginal delivery; Elevated blood pressures in clinic.  No acute events overnight.  Pt denies problems with ambulating, voiding or po intake.  She denies nausea or vomiting.  Pain is well controlled.  She has had flatus. She has not had bowel movement.  Lochia Small.  Plan for birth control is Nexplanon.  Method of Feeding: breast. Denies HA, visual changes, RUQ pain.   Objective: BP 112/64 mmHg  Pulse 77  Temp(Src) 97.9 F (36.6 C) (Oral)  Resp 18  Ht  (1.676 m)  Wt 90.266 kg (199 lb)  BMI 32.13 kg/m2  SpO2 96%  LMP 10/11/2014  Breastfeeding? Unknown  Physical Exam:  General: alert, cooperative and no distress Lochia:normal flow Chest: CTAB Heart: RRR, 1-2/6 systolic murmur best heard on left sternal border Abdomen: +BS, soft, nontender, fundus firm at/below umbilicus Uterine Fundus: firm,  DVT Evaluation: No evidence of DVT seen on physical exam. Extremities: pitting edema 2+ 2/3rds up shins bilaterally; no calf tenderness; 2+ DP pulses   Recent Labs  07/24/15 1650  HGB 9.5*  HCT 29.7*    Assessment/Plan:  ASSESSMENT: LEAHA CUERVO is a 20 y.o. G1P1001 [redacted]w[redacted]d ppd 1 s/p NSVD doing well. Had elevated blood pressures while in labor and shortly afterwards (mainly in 140s/80s), but has had normal blood pressures since 10 PM.   Plan for discharge tomorrow   LOS: 2 days   Palma Holter 07/26/2015, 6:51 AM

## 2015-07-27 ENCOUNTER — Ambulatory Visit: Payer: Self-pay

## 2015-07-27 MED ORDER — IBUPROFEN 600 MG PO TABS: 600.0000 mg | ORAL_TABLET | Freq: Four times a day (QID) | ORAL | Status: DC | PRN

## 2015-07-27 NOTE — Discharge Instructions (Signed)

## 2015-07-27 NOTE — Lactation Note (Signed)
This note was copied from a baby's chart. Lactation Consultation Note: assist mother with latching infant on the left breast. Observed that infant was having a difficult latch. Mother has a crack at the back of the nipple shield. Mother states that infant is not getting the entire nipple in her mouth. Observed that infant has an anterior and possible posterior tongue tie. Observed that infant cups tongue. Mother informed of tongue tightness and advised mother to discuss with Peds or specialist.  Several attempts to latch infant before a sustaining latch for 10 mins. Mother was given a harmony hand pump with instructions to use as needed. Mother advised to continue to feed infant 8-12 times in 24 hours. Mother is active with [facility] and plans to phone tomorrow for appt. Mother is aware of available LC services. Mother states she has good support from her mother and grandmother.   Patient Name: Madeline [name] [id number] Date: 07/27/2015 Reason for consult: Follow-up assessment   Maternal Data    Feeding Feeding Type: Breast Fed Length of feed: 10 min  LATCH Score/Interventions Latch: Repeated attempts needed to sustain latch, nipple held in mouth throughout feeding, stimulation needed to elicit sucking reflex.  Audible Swallowing: A few with stimulation  Type of Nipple: Everted at rest and after stimulation  Comfort (Breast/Nipple): Filling, red/small blisters or bruises, mild/mod discomfort     Hold (Positioning): Assistance needed to correctly position infant at breast and maintain latch. Intervention(s): Support Pillows;Position options  LATCH Score: 6  Lactation Tools Discussed/Used     Consult Status      Michel Bickers 07/27/2015, 2:24 PM

## 2015-07-27 NOTE — Discharge Summary (Signed)
Obstetric Discharge Summary Reason for Admission: induction of labor for concern for gHTN and post term Prenatal Procedures: none Intrapartum Procedures: spontaneous vaginal delivery Postpartum Procedures: none Complications-Operative and Postpartum: 2nd degree perineal laceration   Delivery Note Patient presented sent from clinic for elevated blood pressure at 40 weeks 6 days. Asymptomatic, preE eval wnl, BPs here wnl. Given proximity to post-term, decision made to proceed with induction. Induced with foley, cytotec, arom, and pitocin. Pushed for approximately 20 minutes, no significant fetal heart rate decelerations.  At 5:58 PM a viable female was delivered via Vaginal, Spontaneous Delivery (Presentation: Right Occiput Anterior). APGAR: 2, 8; weight 9 lb 9.6 oz (4355 g).  Placenta status: Intact, Manual removal. Cord: 3 vessels with the following complications: 2 minute 30 second shoulder dystocia. McRoberts and suprapubic pressure not effective. Next rotational maneuver attempted but not effective. Dr. Ashok Pall attempted to deliver the posterior shoulder, and then Dr. Macon Large entered and delivered the posterior shoulder. Cord pH: 7.374  Anesthesia: Epidural  Episiotomy: None Lacerations: 2nd degree Suture Repair: 3.0 vicryl Est. Blood Loss (mL): 250  Mom to postpartum. Baby to Nursery.  Silvano Bilis 07/25/2015, 6:55 PM  PE:  gen- alert, cooperative Lungs: no effort required Heart: RRR Abd: fundus firm LE: no s/s DVT  H/H: Lab Results  Component Value Date/Time   HGB 9.5* 07/24/2015 04:50 PM   HCT 29.7* 07/24/2015 04:50 PM      Discharge Diagnoses: Post-date pregnancy  Discharge Information: Date: 12/20/2010 Activity: unrestricted Diet: routine Medications: Ibuprofen Breast feeding:  Yes Condition: stable Instructions: refer to practice specific booklet Discharge to: home   Everrett Coombe 07/27/2015,7:48 AM   CNM attestation   KOREY ARROYO is a 20  y.o. G1P1001 s/p SVD.   Pain is well controlled.  Plan for birth control is Nexplanon.  Method of Feeding: breast  PE:  BP 148/81 mmHg  Pulse 61  Temp(Src) 97.6 F (36.4 C) (Oral)  Resp 18  Ht  (1.676 m)  Wt 90.266 kg (199 lb)  BMI 32.13 kg/m2  SpO2 96%  LMP 10/11/2014  Breastfeeding? Unknown Fundus firm  No results for input(s): HGB, HCT in the last 72 hours.   Plan: discharge today - postpartum care discussed - f/u clinic in 6 weeks for postpartum visit   Ranard Harte, CNM 2:29 AM

## 2015-08-02 ENCOUNTER — Encounter (HOSPITAL_COMMUNITY): Payer: Self-pay | Admitting: *Deleted

## 2015-08-12 IMAGING — US US OB COMP LESS 14 WK
1 series · 13 of 28 positions shown · non-contrast
Comparison: None.

CLINICAL DATA: Positive pregnancy test, pelvic pain and vaginal
bleeding

EXAM:
OBSTETRIC <14 WK US AND TRANSVAGINAL OB US
TECHNIQUE: Both transabdominal and transvaginal ultrasound examinations were
performed for complete evaluation of the gestation as well as the
maternal uterus, adnexal regions, and pelvic cul-de-sac.
Transvaginal technique was performed to assess early pregnancy.

[Series 1: us ob comp less 14 wk · 0.15mm/px · 59 acquisitions, 13 frames shown]
[im 3/59]
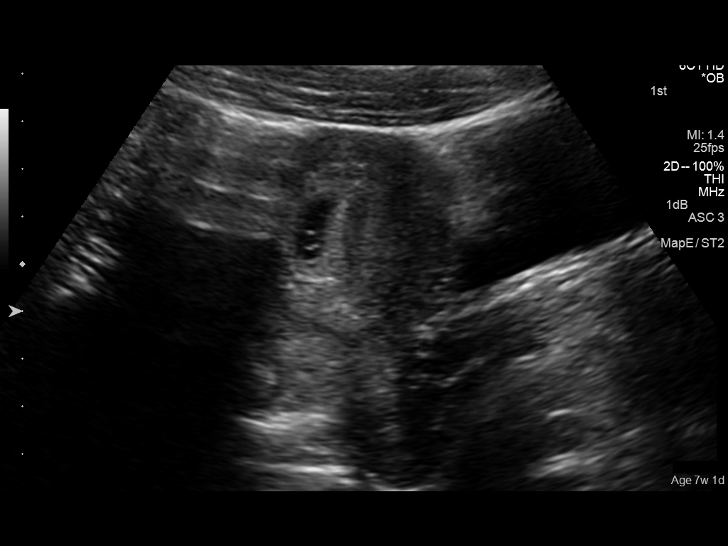
[im 7/59]
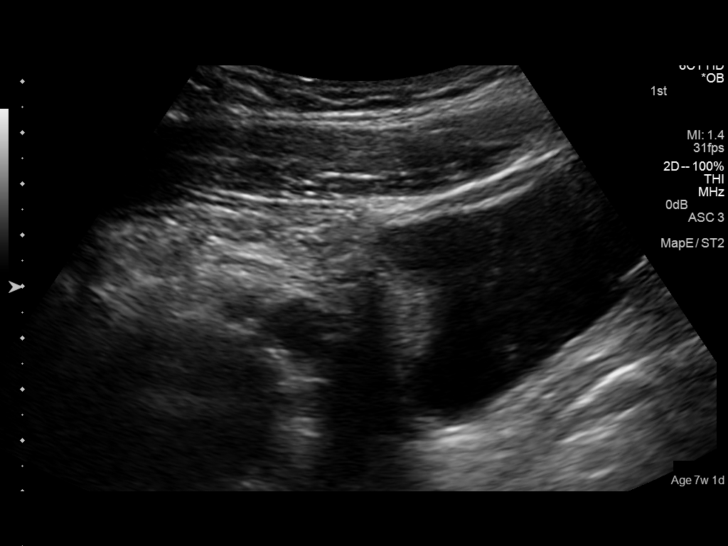
[im 11/59]
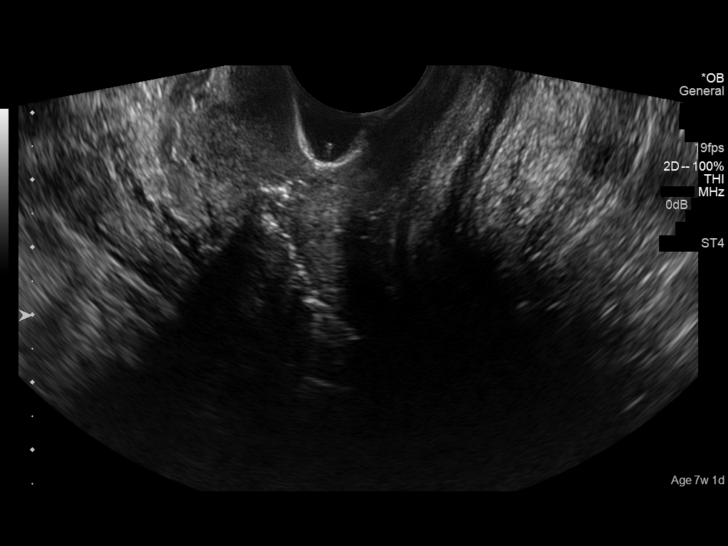
[im 16/59]
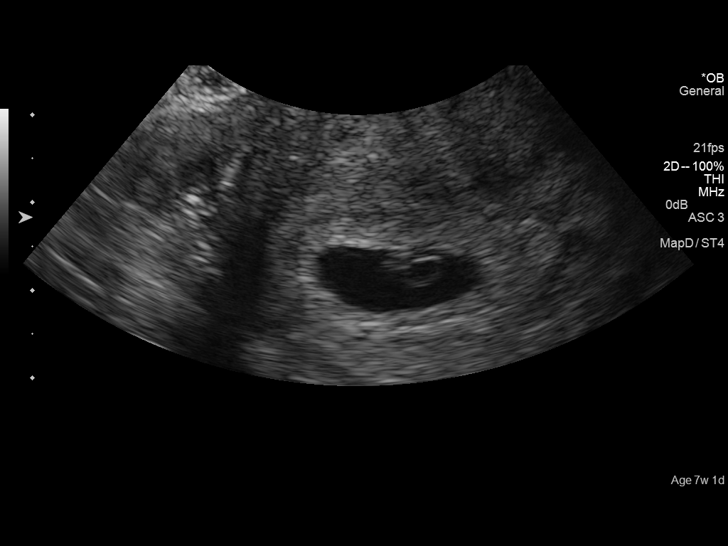
[im 20/59]
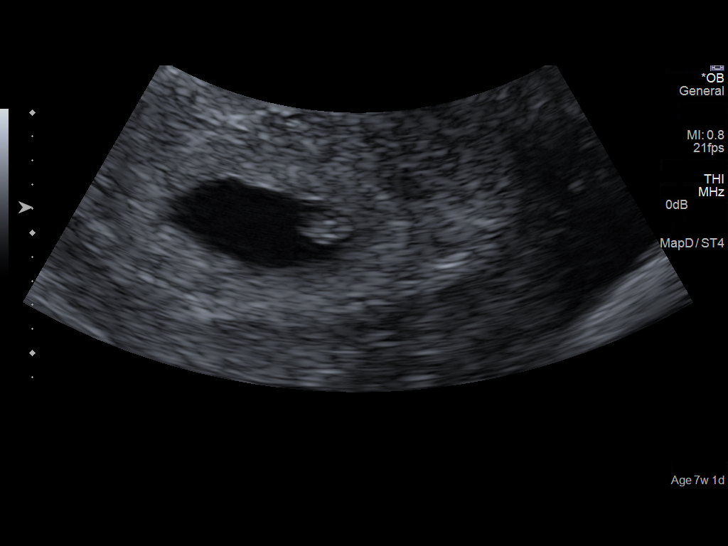
[im 24/59]
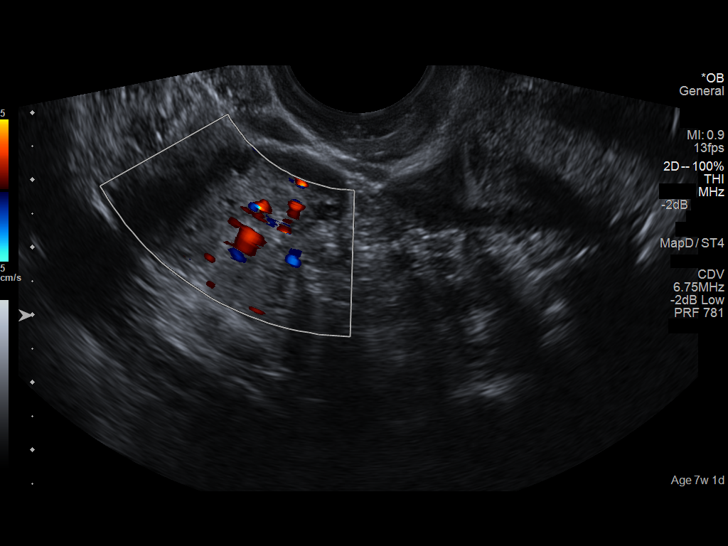
[im 31/59]
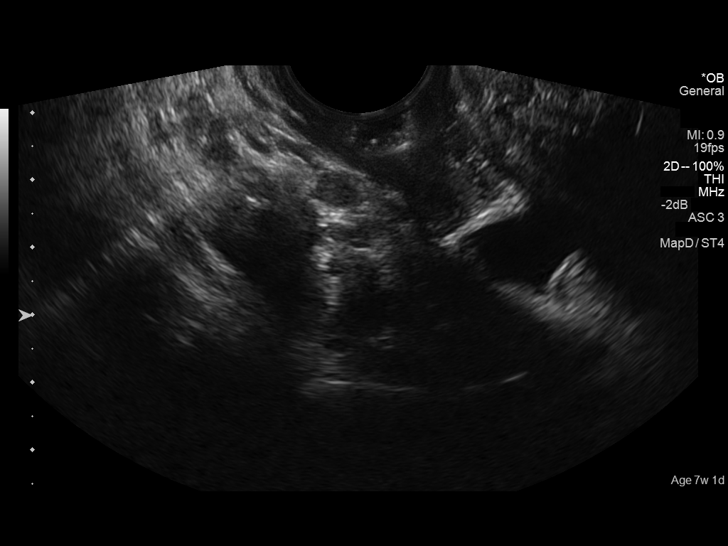
[im 35/59]
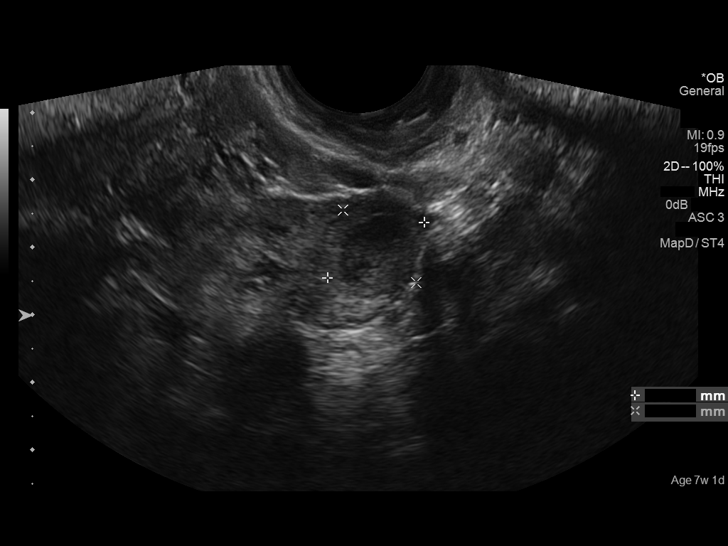
[im 39/59]
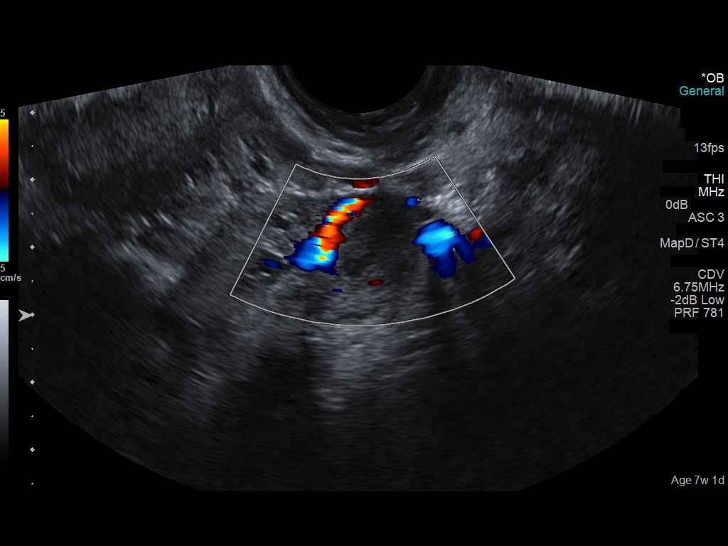
[im 43/59]
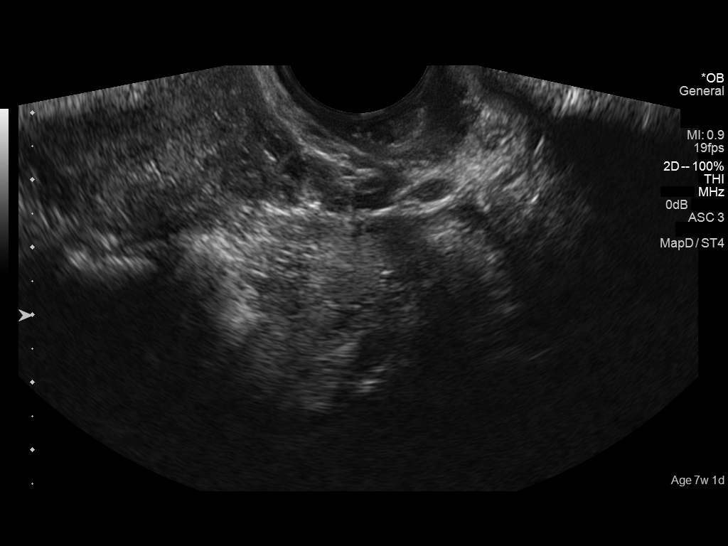
[im 48/59]
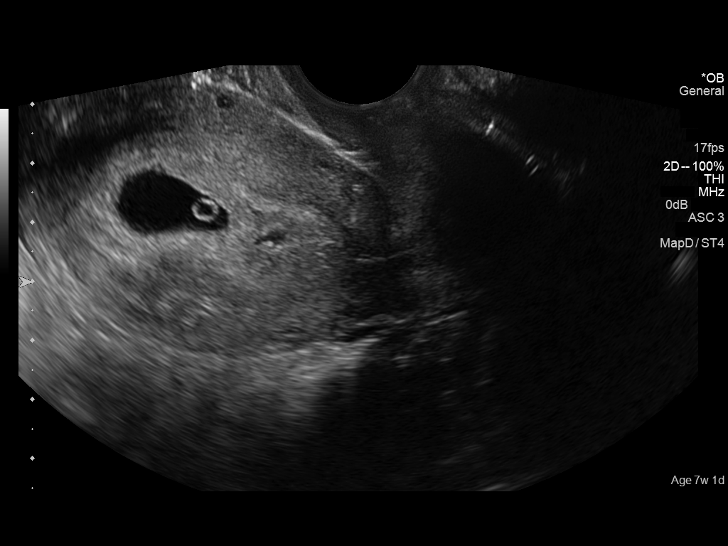
[im 52/59]
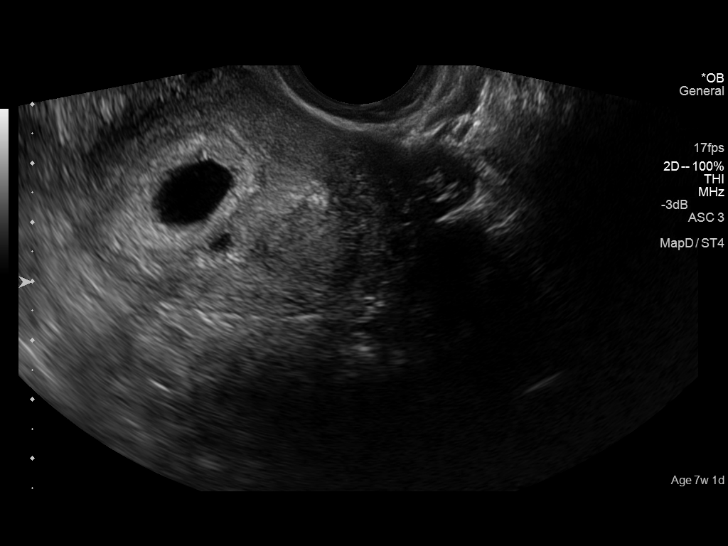
[im 56/59]
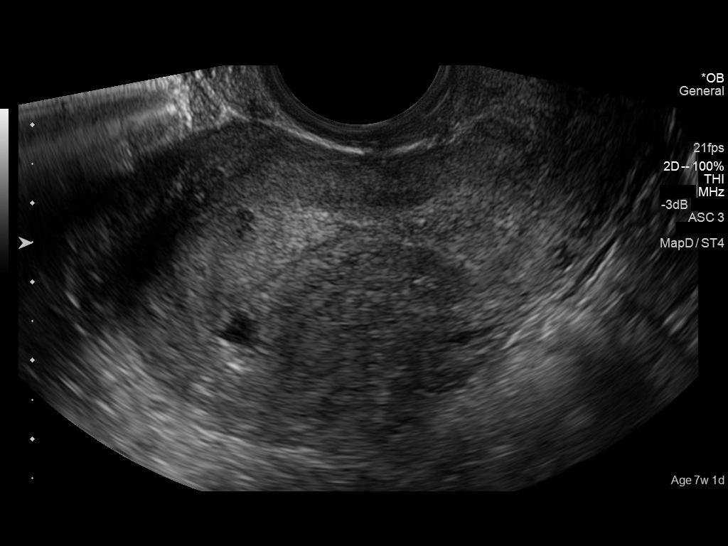

[13 of 28 positions shown; findings below may reference images not displayed]

FINDINGS: Intrauterine gestational sac: Visualized/normal in shape.

Yolk sac:  Visualized

Embryo:  Visualized

Cardiac Activity: Visualized

Heart Rate: 144  bpm

CRL:  7  mm   6 w   4 d                  US EDC: 07/18/15

Maternal uterus/adnexae: Ovaries are normal. No free fluid. Left
ovarian corpus luteum. Hypoechoic oval fluid collection within the
endometrium adjacent to the dominant gestational sac measuring 5 mm
demonstrates an echogenic peripheral ring and could be a second
gestational sac from failed twin pregnancy or possibly subchorionic
hemorrhage.
IMPRESSION: Intrauterine gestational sac with yolk sac, fetal pole, and cardiac
activity, measuring 6 weeks 4 days gestational age by today's
crown-rump length.

A second intrauterine gestational sac measuring 5 mm could represent
a failed twin pregnancy without fetal pole, yolk sac, or cardiac
activity identified, or a small subchorionic hemorrhage in the
setting of vaginal bleeding. This is amenable to followup at the
time of first trimester ultrasound screening exam or anatomic
survey.

## 2016-09-10 IMAGING — CT CT ANGIO CHEST
3 of 14 series · 10 of 37 positions shown · IV contrast (Iohexol (Omnipaque 350))
Comparison: Chest radiograph performed 07/08/2015

CLINICAL DATA: Acute onset of mid chest pain and shortness of
breath. Patient is 38 weeks pregnant. Initial encounter.

EXAM:
CT ANGIOGRAPHY CHEST WITH CONTRAST
TECHNIQUE: Multidetector CT imaging of the chest was performed using the
standard protocol during bolus administration of intravenous
contrast. Multiplanar CT image reconstructions and MIPs were
obtained to evaluate the vascular anatomy.
CONTRAST:  100mL OMNIPAQUE IOHEXOL 350 MG/ML SOLN

[Series 4017: cor mprs · coronal · 0.68mm/px · 1 of 92 slices shown]
[im 46/92  mediastinal]
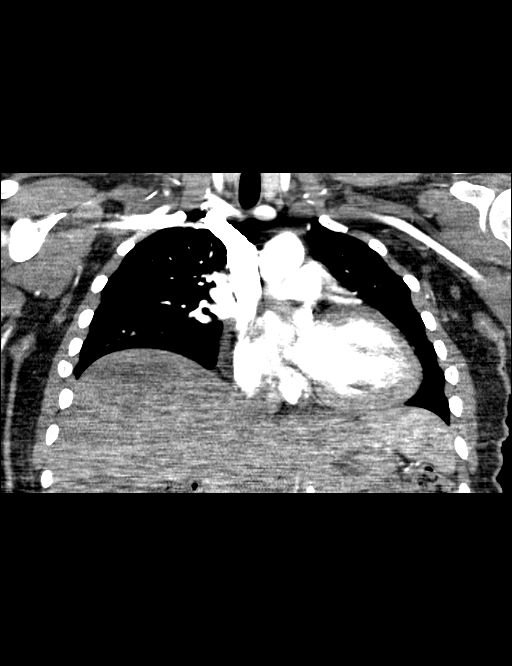

[Series 4019: axial mprs · axial · 0.68mm/px · z∈[-185,-115]mm · 2 of 106 slices shown]
[im 36/106  lung]
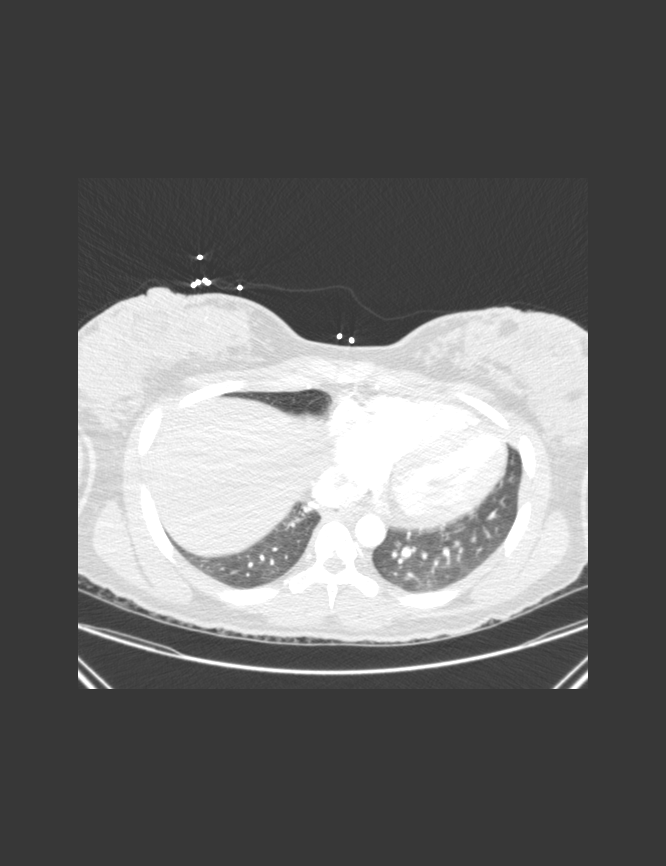
[im 71/106  mediastinal]
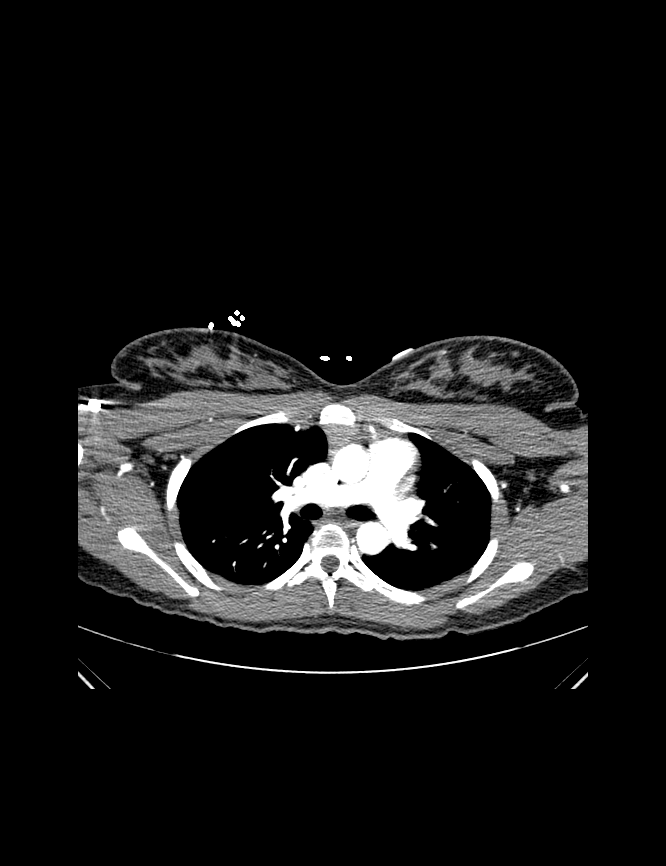

[Series 4023: lung · axial · 0.68mm/px · z∈[-224,-56]mm · 7 of 225 slices shown]
[im 29/225  mediastinal]
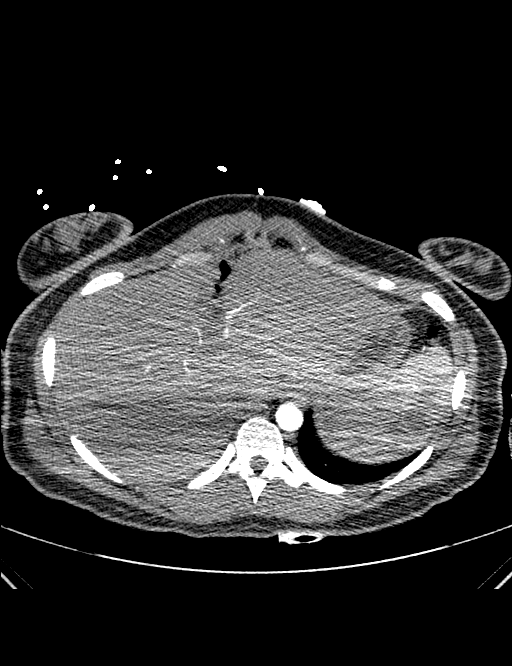
[im 57/225  mediastinal]
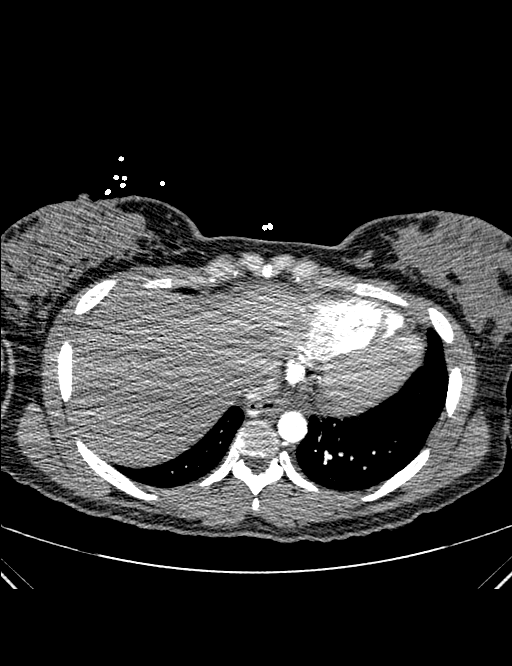
[im 85/225  mediastinal]
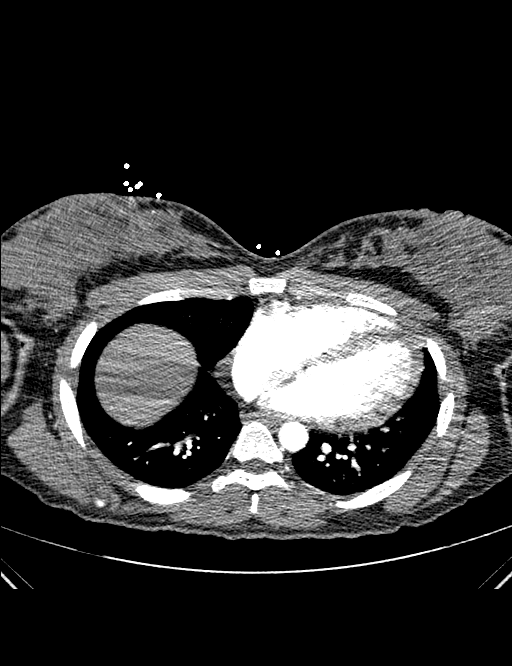
[im 113/225  mediastinal]
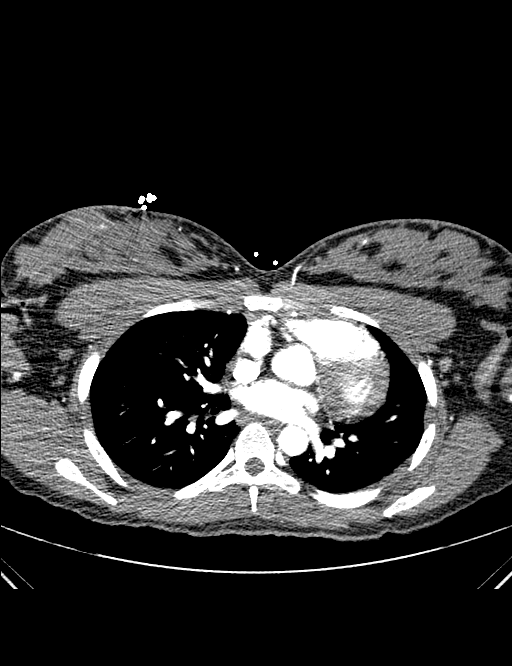
[im 141/225  mediastinal]
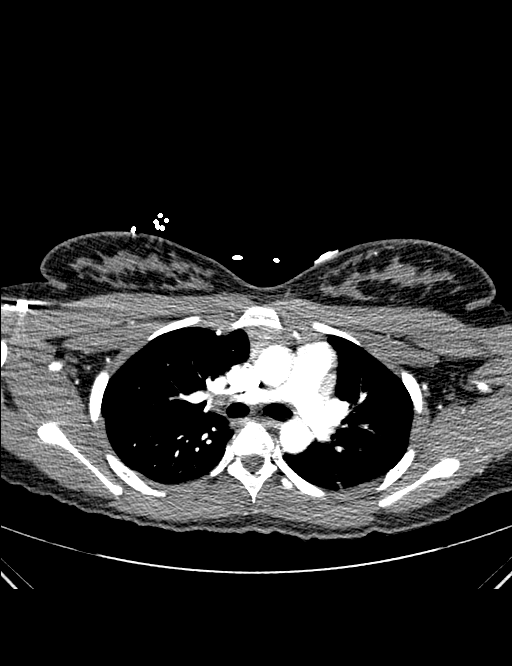
[im 169/225  mediastinal]
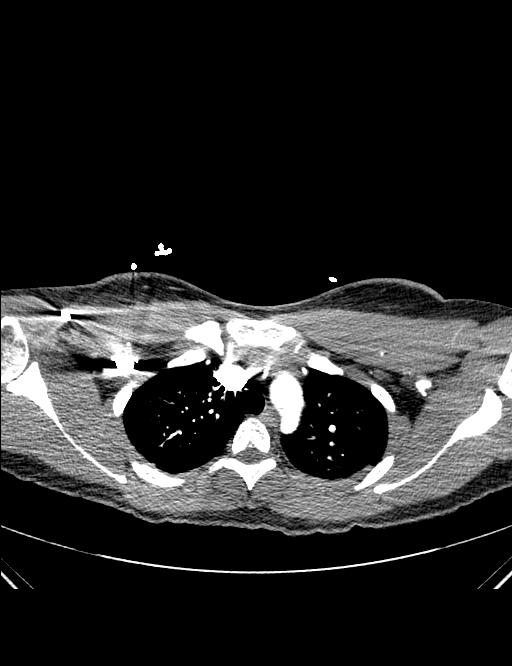
[im 197/225  mediastinal]
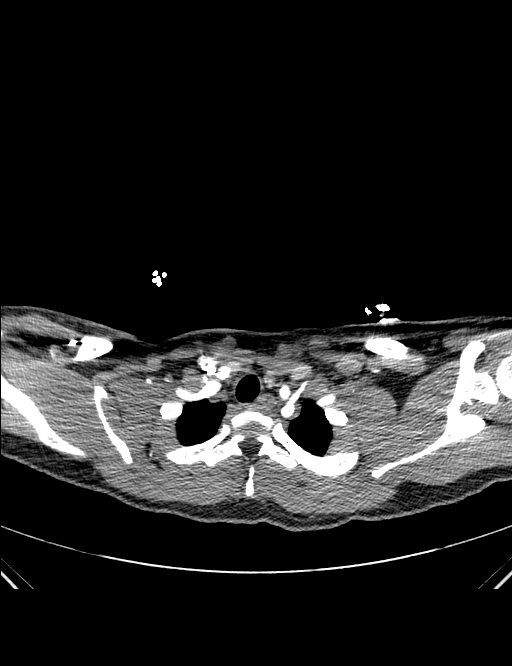

[10 of 37 positions shown; findings below may reference images not displayed]

FINDINGS: There is no evidence of pulmonary embolus.

Minimal left-sided opacities may reflect atelectasis or possibly
mild infection. The right lung appears relatively clear. No pleural
effusion or pneumothorax is seen. No masses are identified; no
abnormal focal contrast enhancement is seen.

The mediastinum is unremarkable in appearance. No mediastinal
lymphadenopathy is seen. No pericardial effusion is identified. The
great vessels are grossly unremarkable in appearance. No axillary
lymphadenopathy is seen. The visualized portions of the thyroid
gland are unremarkable in appearance.

The visualized portions of the liver and spleen are unremarkable.
The edge of the uterine fundus is imaged. The visualized portions of
the pancreas, stomach and adrenal glands are grossly unremarkable.

No acute osseous abnormalities are seen.

Review of the MIP images confirms the above findings.
IMPRESSION: 1. No evidence of pulmonary embolus.
2. Minimal left-sided airspace opacities may reflect atelectasis or
possibly mild infection.

## 2018-12-30 ENCOUNTER — Other Ambulatory Visit: Payer: Self-pay

## 2018-12-30 ENCOUNTER — Emergency Department (HOSPITAL_BASED_OUTPATIENT_CLINIC_OR_DEPARTMENT_OTHER): Payer: Medicaid Other

## 2018-12-30 ENCOUNTER — Encounter (HOSPITAL_BASED_OUTPATIENT_CLINIC_OR_DEPARTMENT_OTHER): Payer: Self-pay

## 2018-12-30 ENCOUNTER — Emergency Department (HOSPITAL_BASED_OUTPATIENT_CLINIC_OR_DEPARTMENT_OTHER)
Admission: EM | Admit: 2018-12-30 | Discharge: 2018-12-30 | Disposition: A | Discharge status: Home / Self Care | Payer: Medicaid Other | Attending: Emergency Medicine | Admitting: Emergency Medicine

## 2018-12-30 DIAGNOSIS — F17200 Nicotine dependence, unspecified, uncomplicated: Secondary | ICD-10-CM | POA: Insufficient documentation

## 2018-12-30 DIAGNOSIS — O26899 Other specified pregnancy related conditions, unspecified trimester: Secondary | ICD-10-CM | POA: Diagnosis not present

## 2018-12-30 DIAGNOSIS — Z3A00 Weeks of gestation of pregnancy not specified: Secondary | ICD-10-CM | POA: Insufficient documentation

## 2018-12-30 DIAGNOSIS — O99330 Smoking (tobacco) complicating pregnancy, unspecified trimester: Secondary | ICD-10-CM | POA: Insufficient documentation

## 2018-12-30 DIAGNOSIS — R079 Chest pain, unspecified: Secondary | ICD-10-CM | POA: Diagnosis not present

## 2018-12-30 LAB — PREGNANCY, URINE: Preg Test, Ur: POSITIVE — AB

## 2018-12-30 MED ORDER — ACETAMINOPHEN 500 MG PO TABS
1000.0000 mg | ORAL_TABLET | Freq: Once | ORAL | Status: AC
  Administered 2018-12-30: 1000 mg via ORAL
  Filled 2018-12-30: qty 2

## 2018-12-30 NOTE — ED Notes (Signed)
Patient transported to X-ray 

## 2018-12-30 NOTE — Discharge Instructions (Addendum)
You were evaluated in the Emergency Department and after careful evaluation, we did not find any emergent condition requiring admission or further testing in the hospital.  Your symptoms today seem to be due to muscle strain or spasm in the chest.  Please continue to take Tylenol at home as needed for pain.  Please return to the Emergency Department if you experience any worsening of your condition.  We encourage you to follow up with a primary care provider.  Thank you for allowing [place] to be a part of your care.

## 2018-12-30 NOTE — ED Triage Notes (Signed)
Pt c/o CP, nausea since 9am-pt hyperventilating-encouraged to take slow deep breaths-pt also reports pos preg test-taken to tx area via w/c

## 2018-12-30 NOTE — ED Provider Notes (Signed)
[facility] Emergency Department Provider Note MRN:  [id number]  Arrival date & time: 12/30/18     Chief Complaint   Chest Pain   History of Present Illness   Madeline [name] is a 23 y.o. year-old female with no pertinent past medical history presenting to the ED with chief complaint of chest pain.  Patient explains that she found that she was pregnant last week.  For the past 1 or 2 days she has been experiencing left-sided chest pain, described as sharp worse with motion, worse with palpation.  She denies headache or vision change, no shortness of breath, no abdominal pain, no vaginal bleeding or discharge, no leakage of fluid, no recent leg pain or swelling, no recent travel.  Review of Systems  A complete 10 system review of systems was obtained and all systems are negative except as noted in the HPI and PMH.   Patient's Health History    Past Medical History:  Diagnosis Date  . HPV (human papilloma virus) infection   . Scabies    treated during this pregnancy    Past Surgical History:  Procedure Laterality Date  . NO PAST SURGERIES      Family History  Problem Relation Age of Onset  . Cancer Neg Hx   . Diabetes Neg Hx   . Hypertension Neg Hx     Social History   Socioeconomic History  . Marital status: Single    Spouse name: Not on file  . Number of children: Not on file  . Years of education: Not on file  . Highest education level: Not on file  Occupational History  . Not on file  Social Needs  . Financial resource strain: Not on file  . Food insecurity    Worry: Not on file    Inability: Not on file  . Transportation needs    Medical: Not on file    Non-medical: Not on file  Tobacco Use  . Smoking status: Current Every Day Smoker  . Smokeless tobacco: Never Used  Substance and Sexual Activity  . Alcohol use: No  . Drug use: No  . Sexual activity: Not on file  Lifestyle  . Physical activity    Days per week: Not on  file    Minutes per session: Not on file  . Stress: Not on file  Relationships  . Social Herbalist on phone: Not on file    Gets together: Not on file    Attends religious service: Not on file    Active member of club or organization: Not on file    Attends meetings of clubs or organizations: Not on file    Relationship status: Not on file  . Intimate partner violence    Fear of current or ex partner: Not on file    Emotionally abused: Not on file    Physically abused: Not on file    Forced sexual activity: Not on file  Other Topics Concern  . Not on file  Social History Narrative  . Not on file     Physical Exam  Vital Signs and Nursing Notes reviewed Vitals:   12/30/18 1205  BP: 107/66  Pulse: 80  Resp: (!) 24  Temp: 98.4 F (36.9 C)  SpO2: 100%    CONSTITUTIONAL: Well-appearing, NAD NEURO:  Alert and oriented x 3, no focal deficits EYES:  eyes equal and reactive ENT/NECK:  no LAD, no JVD CARDIO: Regular rate, well-perfused, normal S1  and S2 PULM:  CTAB no wheezing or rhonchi GI/GU:  normal bowel sounds, non-distended, non-tender MSK/SPINE:  No gross deformities, no edema SKIN:  no rash, atraumatic PSYCH:  Appropriate speech and behavior  Diagnostic and Interventional Summary    EKG Interpretation  Date/Time:  Wednesday December 30 2018 12:07:02 EDT Ventricular Rate:  80 PR Interval:    QRS Duration: 79 QT Interval:  365 QTC Calculation: 421 R Axis:   45 Text Interpretation:  Sinus rhythm Confirmed by Kennis CarinaBero, Dejaun Vidrio [phone]([id number]) on 12/30/2018 12:32:29 PM      Labs Reviewed  PREGNANCY, URINE - Abnormal; Notable for the following components:      Result Value   Preg Test, Ur POSITIVE (*)    All other components within normal limits    DG Chest 2 View  Final Result      Medications  acetaminophen (TYLENOL) tablet 1,000 mg (1,000 mg Oral Given 12/30/18 1250)     Procedures Critical Care  ED Course and Medical Decision Making  I have reviewed  the triage vital signs and the nursing notes.  Pertinent labs & imaging results that were available during my care of the patient were reviewed by me and considered in my medical decision making (see below for details).  Consistent with MSK chest pain.  Patient is 23 years old and otherwise healthy, her pregnancy does raise the concern for VTE, however she exhibits no evidence of DVT, her pain is clearly reproducible with musculature exam, she is PERC negative.  EKG is without concerning features or changes, chest x-ray is unremarkable.  Strict return precautions for worsening symptoms.  After the discussed management above, the patient was determined to be safe for discharge.  The patient was in agreement with this plan and all questions regarding their care were answered.  ED return precautions were discussed and the patient will return to the ED with any significant worsening of condition.  Elmer SowMichael M. Pilar PlateBero, MD [facility] [email or web address]  Final Clinical Impressions(s) / ED Diagnoses     ICD-10-CM   1. Chest pain  R07.9 DG Chest 2 View    DG Chest 2 View    ED Discharge Orders    None         Sabas SousBero, Alaynah Schutter M, MD 12/30/18 1409

## 2019-07-17 ENCOUNTER — Emergency Department (HOSPITAL_BASED_OUTPATIENT_CLINIC_OR_DEPARTMENT_OTHER)
Admission: EM | Admit: 2019-07-17 | Discharge: 2019-07-17 | Disposition: A | Discharge status: Home / Self Care | Payer: Medicaid Other | Attending: Emergency Medicine | Admitting: Emergency Medicine

## 2019-07-17 ENCOUNTER — Emergency Department (HOSPITAL_BASED_OUTPATIENT_CLINIC_OR_DEPARTMENT_OTHER): Payer: Medicaid Other

## 2019-07-17 ENCOUNTER — Encounter (HOSPITAL_BASED_OUTPATIENT_CLINIC_OR_DEPARTMENT_OTHER): Payer: Self-pay | Admitting: Emergency Medicine

## 2019-07-17 ENCOUNTER — Other Ambulatory Visit: Payer: Self-pay

## 2019-07-17 DIAGNOSIS — Y999 Unspecified external cause status: Secondary | ICD-10-CM | POA: Insufficient documentation

## 2019-07-17 DIAGNOSIS — W2209XA Striking against other stationary object, initial encounter: Secondary | ICD-10-CM | POA: Diagnosis not present

## 2019-07-17 DIAGNOSIS — Y9302 Activity, running: Secondary | ICD-10-CM | POA: Diagnosis not present

## 2019-07-17 DIAGNOSIS — Y9289 Other specified places as the place of occurrence of the external cause: Secondary | ICD-10-CM | POA: Insufficient documentation

## 2019-07-17 DIAGNOSIS — S8992XA Unspecified injury of left lower leg, initial encounter: Secondary | ICD-10-CM | POA: Diagnosis present

## 2019-07-17 DIAGNOSIS — F17200 Nicotine dependence, unspecified, uncomplicated: Secondary | ICD-10-CM | POA: Diagnosis not present

## 2019-07-17 DIAGNOSIS — S82492A Other fracture of shaft of left fibula, initial encounter for closed fracture: Secondary | ICD-10-CM | POA: Diagnosis not present

## 2019-07-17 NOTE — ED Notes (Deleted)
PA at bedside irrigating wound

## 2019-07-17 NOTE — Discharge Instructions (Addendum)
Please read instructions below. Keep the boot on at all times when up/walking. Avoid weightbearing as much as possible. Elevate your leg as much as possible. You can take 600mg  ibuprofen every 6 hours as needed for pain. Call the orthopedic specialist office the next business day to schedule an appointment for repeat xray and follow up on your injury in about 1-2 weeks. Return to the ED for concerning symptoms.

## 2019-07-17 NOTE — ED Triage Notes (Signed)
Pt states she was running yesterday and hit her left leg on on a wooden door frame. Pt calf is hurting. Pt states she has more pain when she walks or puts weight on it.

## 2019-07-17 NOTE — ED Provider Notes (Signed)
[facility] EMERGENCY DEPARTMENT Provider Note   CSN: [id number] Arrival date & time: 07/17/19  1024     History Chief Complaint  Patient presents with  . Leg Pain    Madeline [name] is a 24 y.o. female presenting to the ED with complaint of left lower leg pain that began yesterday. Patient reports she was running yesterday and had her leg on a wooden door frame. The pain is located lateral distal lower leg that is worse with movement. She denies wounds, numbness or tingling. She treated her symptoms with a dose of ibuprofen.  The history is provided by the patient.       Past Medical History:  Diagnosis Date  . HPV (human papilloma virus) infection   . Scabies    treated during this pregnancy    Patient Active Problem List   Diagnosis Date Noted  . Shoulder dystocia 07/25/2015  . Elevated blood pressure affecting pregnancy, antepartum 07/24/2015  . Elevated blood pressure affecting pregnancy in third trimester, antepartum 07/24/2015    Past Surgical History:  Procedure Laterality Date  . NO PAST SURGERIES       OB History    Gravida  2   Para  1   Term  1   Preterm      AB      Living  1     SAB      TAB      Ectopic      Multiple  0   Live Births  1        Obstetric Comments  2 minute 30 second shoulder dystocia        Family History  Problem Relation Age of Onset  . Cancer Neg Hx   . Diabetes Neg Hx   . Hypertension Neg Hx     Social History   Tobacco Use  . Smoking status: Current Every Day Smoker  . Smokeless tobacco: Never Used  Substance Use Topics  . Alcohol use: No  . Drug use: No    Home Medications Prior to Admission medications   Not on File    Allergies    Patient has no known allergies.  Review of Systems   Review of Systems  Musculoskeletal: Positive for myalgias.  All other systems reviewed and are negative.   Physical Exam Updated Vital Signs BP 119/77 (BP Location: Right Arm)   Pulse  72   Temp 98.2 F (36.8 C) (Oral)   Resp 14   LMP 06/30/2019 (Within Days)   SpO2 100%   Breastfeeding Unknown   Physical Exam Vitals and nursing note reviewed.  Constitutional:      General: She is not in acute distress.    Appearance: She is well-developed.  HENT:     Head: Normocephalic and atraumatic.  Eyes:     Conjunctiva/sclera: Conjunctivae normal.  Cardiovascular:     Rate and Rhythm: Normal rate.  Pulmonary:     Effort: Pulmonary effort is normal.  Musculoskeletal:     Comments: Left lower leg without deformity, swelling, bruising, or wounds. TTP to anterior lateral aspect of distal half of the lower leg. No ttp along tibia. Ankle without tenderness. Dorsi and plantar flexion of the ankle causes pain in the leg.  Neurological:     Mental Status: She is alert.  Psychiatric:        Mood and Affect: Mood normal.        Behavior: Behavior normal.     ED  Results / Procedures / Treatments   Labs (all labs ordered are listed, but only abnormal results are displayed) Labs Reviewed - No data to display  EKG None  Radiology DG Tibia/Fibula Left  Result Date: 07/17/2019 CLINICAL DATA:  24 year old female with injury to the shin EXAM: LEFT TIBIA AND FIBULA - 2 VIEW COMPARISON:  None. FINDINGS: Lateral view demonstrates a lucency at the posterior fibula midshaft. This is only view bowl on the lateral images. No acute bony abnormality of the tibia. No focal soft tissue swelling.  No radiopaque foreign body. IMPRESSION: Negative for acute fracture of the tibia. There is a lucency at the posterior cortex of the fibula midshaft, which is favored to represent a vascular channel. Correlation with point tenderness at the posterior fibula may be useful. Electronically Signed   By: Corrie Mckusick D.O.   On: 07/17/2019 11:54    Procedures Procedures (including critical care time)  Medications Ordered in ED Medications - No data to display  ED Course  I have reviewed the triage  vital signs and the nursing notes.  Pertinent labs & imaging results that were available during my care of the patient were reviewed by me and considered in my medical decision making (see chart for details).    MDM Rules/Calculators/A&P                      Patient presenting with left leg pain after bumping it against a doorway.  Exam with normal range of motion, no wounds, swelling, deformity or bruising.  X-ray with lucency posterior midshaft of the fibula; question possible vascular channel vs acute fracture. Pt with tenderness in this area, with treat with camwalker boot and crutches as a precaution and provide outpt follow up.   Discussed symptomatic management including RICE therapy, OTC medications for pain.  Ortho f/u. Patient is safe for discharge.  Discussed results, findings, treatment and follow up. Patient advised of return precautions. Patient verbalized understanding and agreed with plan.  Final Clinical Impression(s) / ED Diagnoses Final diagnoses:  Other closed fracture of shaft of left fibula, initial encounter    Rx / DC Orders ED Discharge Orders    None       Deionte Spivack, [place] N, PA-C 07/17/19 1225    Isla Pence, MD 07/17/19 1246
